# Patient Record
Sex: Male | Born: 1965 | Race: White | Hispanic: No | Marital: Single | Smoking: Current every day smoker
Health system: Southern US, Community
[De-identification: ages and names within clinical notes are randomized; demographics above are authoritative.]

## PROBLEM LIST (undated history)

## (undated) ENCOUNTER — Emergency Department: Payer: Self-pay

## (undated) DIAGNOSIS — Z955 Presence of coronary angioplasty implant and graft: Secondary | ICD-10-CM

## (undated) DIAGNOSIS — N200 Calculus of kidney: Secondary | ICD-10-CM

## (undated) DIAGNOSIS — I499 Cardiac arrhythmia, unspecified: Secondary | ICD-10-CM

## (undated) DIAGNOSIS — M1990 Unspecified osteoarthritis, unspecified site: Secondary | ICD-10-CM

## (undated) DIAGNOSIS — I10 Essential (primary) hypertension: Secondary | ICD-10-CM

## (undated) DIAGNOSIS — I700 Atherosclerosis of aorta: Secondary | ICD-10-CM

## (undated) DIAGNOSIS — E785 Hyperlipidemia, unspecified: Secondary | ICD-10-CM

## (undated) DIAGNOSIS — I77810 Thoracic aortic ectasia: Secondary | ICD-10-CM

## (undated) DIAGNOSIS — R600 Localized edema: Secondary | ICD-10-CM

## (undated) DIAGNOSIS — F419 Anxiety disorder, unspecified: Secondary | ICD-10-CM

## (undated) DIAGNOSIS — K219 Gastro-esophageal reflux disease without esophagitis: Secondary | ICD-10-CM

## (undated) DIAGNOSIS — R0600 Dyspnea, unspecified: Secondary | ICD-10-CM

## (undated) DIAGNOSIS — E669 Obesity, unspecified: Secondary | ICD-10-CM

## (undated) DIAGNOSIS — Z79899 Other long term (current) drug therapy: Secondary | ICD-10-CM

## (undated) DIAGNOSIS — Z7901 Long term (current) use of anticoagulants: Secondary | ICD-10-CM

## (undated) DIAGNOSIS — G4730 Sleep apnea, unspecified: Secondary | ICD-10-CM

## (undated) DIAGNOSIS — Z720 Tobacco use: Secondary | ICD-10-CM

## (undated) DIAGNOSIS — G4700 Insomnia, unspecified: Secondary | ICD-10-CM

## (undated) DIAGNOSIS — I219 Acute myocardial infarction, unspecified: Secondary | ICD-10-CM

## (undated) DIAGNOSIS — Z87442 Personal history of urinary calculi: Secondary | ICD-10-CM

## (undated) DIAGNOSIS — J449 Chronic obstructive pulmonary disease, unspecified: Secondary | ICD-10-CM

## (undated) DIAGNOSIS — I209 Angina pectoris, unspecified: Secondary | ICD-10-CM

## (undated) DIAGNOSIS — K449 Diaphragmatic hernia without obstruction or gangrene: Secondary | ICD-10-CM

## (undated) DIAGNOSIS — K5790 Diverticulosis of intestine, part unspecified, without perforation or abscess without bleeding: Secondary | ICD-10-CM

## (undated) DIAGNOSIS — G4733 Obstructive sleep apnea (adult) (pediatric): Secondary | ICD-10-CM

## (undated) DIAGNOSIS — I2510 Atherosclerotic heart disease of native coronary artery without angina pectoris: Secondary | ICD-10-CM

## (undated) HISTORY — PX: ABDOMINAL SURGERY: SHX537

## (undated) HISTORY — PX: HERNIA REPAIR: SHX51

## (undated) HISTORY — PX: OTHER SURGICAL HISTORY: SHX169

---

## 2006-04-06 ENCOUNTER — Inpatient Hospital Stay: Payer: Self-pay | Admitting: Internal Medicine

## 2006-08-07 ENCOUNTER — Other Ambulatory Visit: Payer: Self-pay

## 2006-08-07 ENCOUNTER — Inpatient Hospital Stay: Payer: Self-pay | Admitting: Internal Medicine

## 2006-08-30 ENCOUNTER — Emergency Department: Payer: Self-pay | Admitting: Emergency Medicine

## 2007-04-10 ENCOUNTER — Observation Stay: Payer: Self-pay | Admitting: Internal Medicine

## 2007-04-10 ENCOUNTER — Other Ambulatory Visit: Payer: Self-pay

## 2008-01-30 ENCOUNTER — Observation Stay: Payer: Self-pay | Admitting: Internal Medicine

## 2009-03-25 IMAGING — CT CT ABD-PELV W/ CM
1 of 2 series · 15 of 32 positions shown, 19 images · non-contrast
Comparison: none

REASON FOR EXAM: (1) RLQ abdominal pain; (2) same
COMMENTS:

[Series 2: appendicitis · axial · 0.91mm/px · z∈[-328,+134]mm · 15 of 168 slices shown, 19 images]
[im 7/168  soft-tissue]
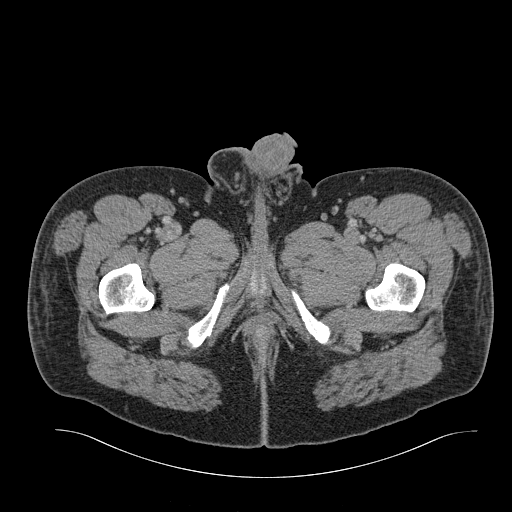
[im 7/168  bone]
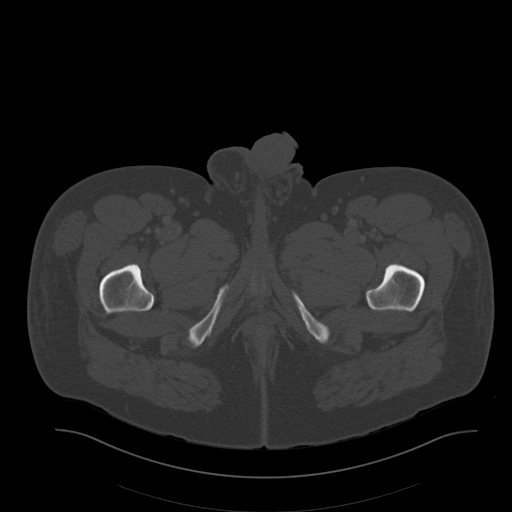
[im 20/168  soft-tissue]
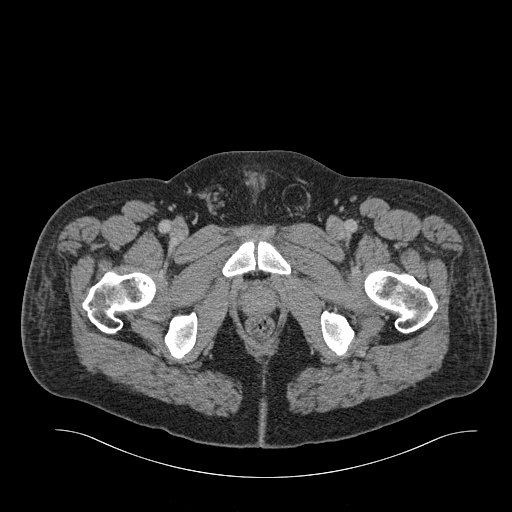
[im 33/168  soft-tissue]
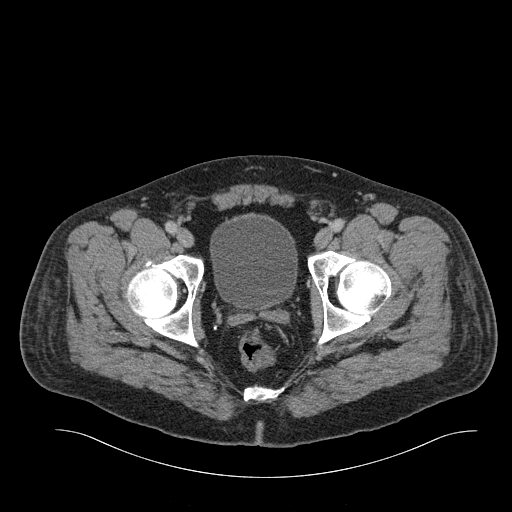
[im 45/168  soft-tissue]
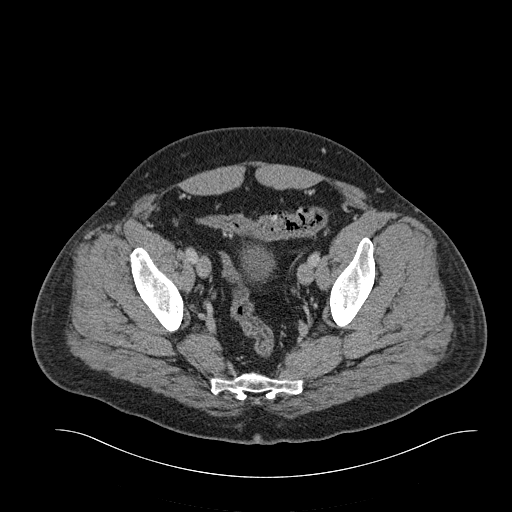
[im 58/168  soft-tissue]
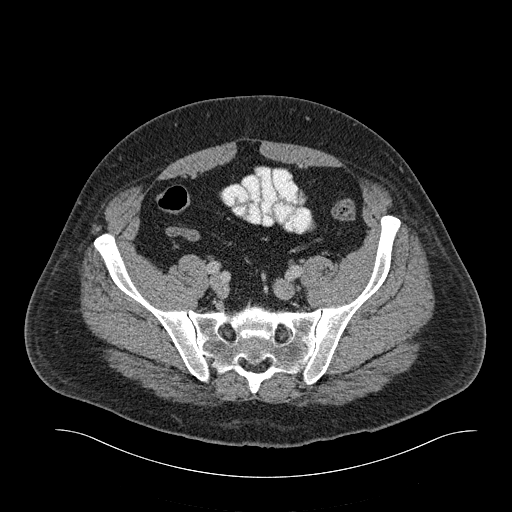
[im 71/168  soft-tissue]
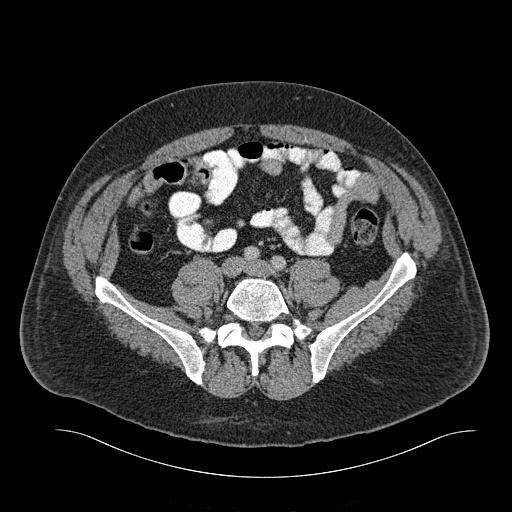
[im 84/168  soft-tissue]
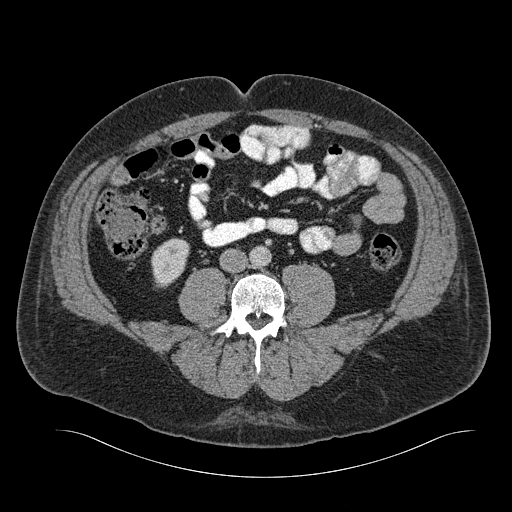
[im 97/168  soft-tissue]
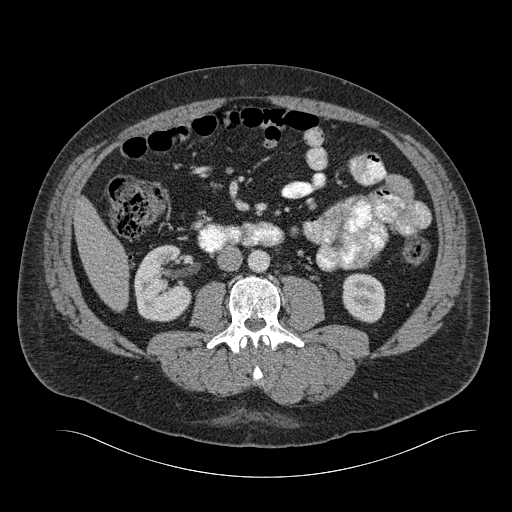
[im 110/168  soft-tissue]
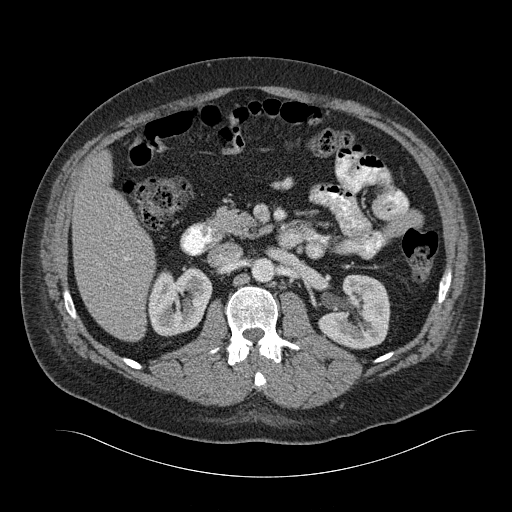
[im 110/168  bone]
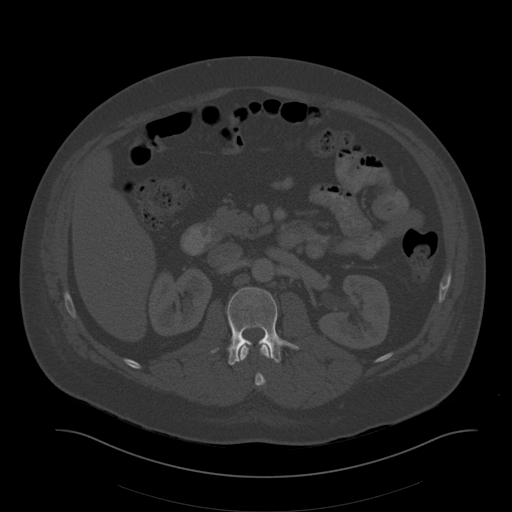
[im 123/168  soft-tissue]
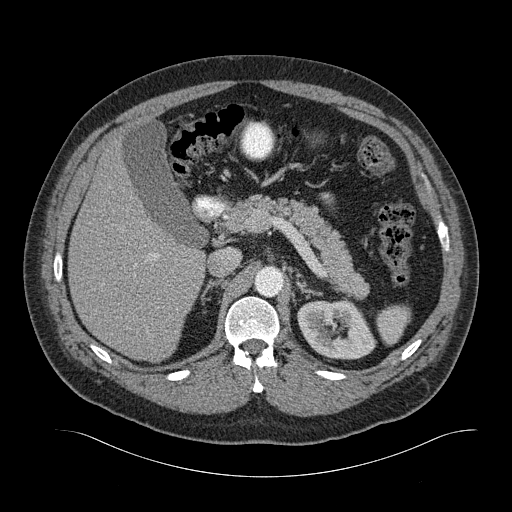
[im 135/168  soft-tissue]
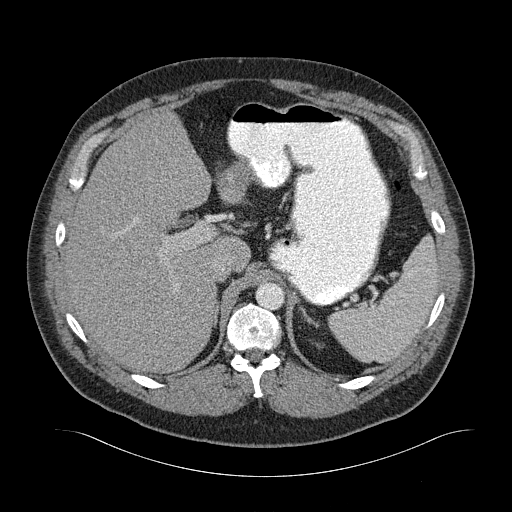
[im 142/168  lung]
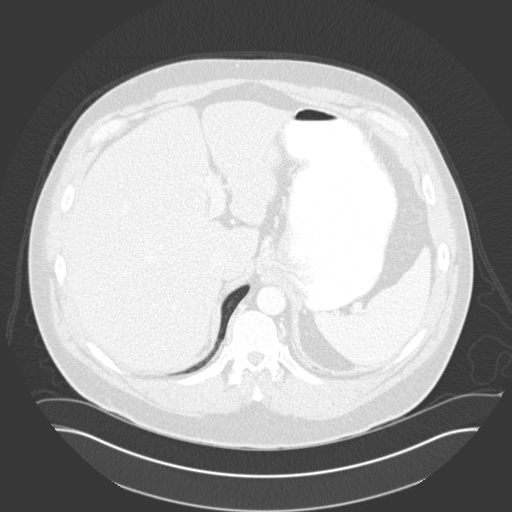
[im 148/168  soft-tissue]
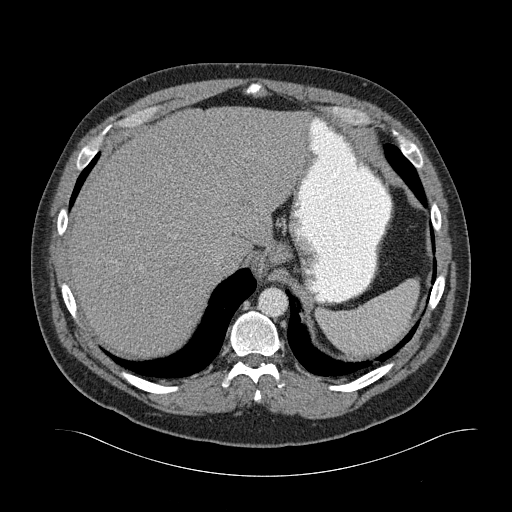
[im 148/168  lung]
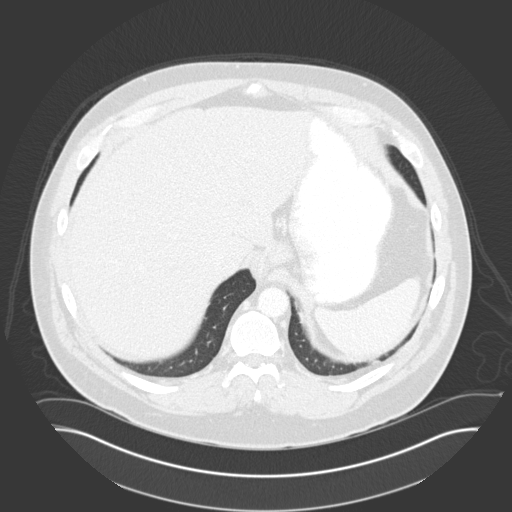
[im 155/168  lung]
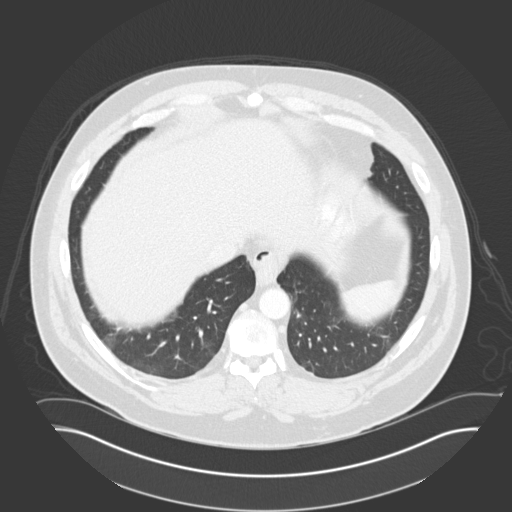
[im 161/168  soft-tissue]
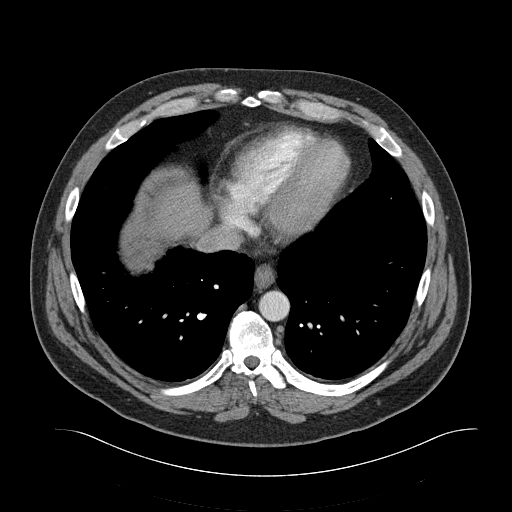
[im 161/168  lung]
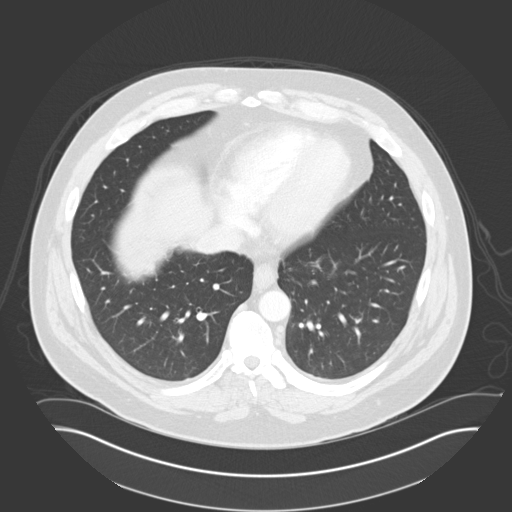

[15 of 32 positions shown; findings below may reference images not displayed]

PROCEDURE:     CT  - CT ABDOMEN / PELVIS  W  - April 07, 2006  [DATE]

RESULT:       Comparison is made to prior study dated 04/06/06.

Helical 3 mm sections were obtained from the lung bases through the pubic
symphysis status post the administration of oral and intravenous
administration of 100 ml of [id number].

Evaluation of the lung bases demonstrates no gross abnormalities.

The liver, spleen, adrenals and pancreas are unremarkable.  There is no
evidence of calcified gallstones within the gallbladder.  A nonobstructing
calcified calculus is demonstrated within the lower pole medullary portion
of the LEFT kidney.  A low attenuating mass is demonstrated within the lower
pole of the RIGHT kidney likely representing a cyst.  The appendix is
identified and demonstrates no radiographic abnormalities.  There is no
evidence of abdominal or pelvic free fluid, drainable loculated fluid
collections, masses or adenopathy.  The celiac, SMA, IMA, portal vein and
SMV are patent. Evaluation of the pelvis demonstrates evidence of
diverticulosis without CT evidence to suggest diverticulitis.
Diverticulosis is demonstrated within the sigmoid colon.  There is no
evidence of pelvic free fluid, drainable loculated fluid collections, masses
or adenopathy.
IMPRESSION: 1.     No CT evidence to suggest the sequela of appendicitis, diverticulitis
or bowel obstruction.
2.     Diverticulosis is demonstrated within the sigmoid colon.

## 2009-03-26 IMAGING — CR DG SHOULDER 3+V*L*
1 series · 3 of 3 positions shown · non-contrast
Comparison: none

REASON FOR EXAM: pain, fall
COMMENTS:

PROCEDURE:     DXR - DXR SHOULDER LEFT COMPLETE  - April 08, 2006  [DATE]
RESULT:     Three views show a mildly comminuted fracture of the humeral
head at the base of the greater tuberosity. No additional fractures about
the shoulder are seen. No dislocation is noted.

[Series 1: view not recorded · 0.17mm/px · 3 of 3 slices shown]
[im 1/3]
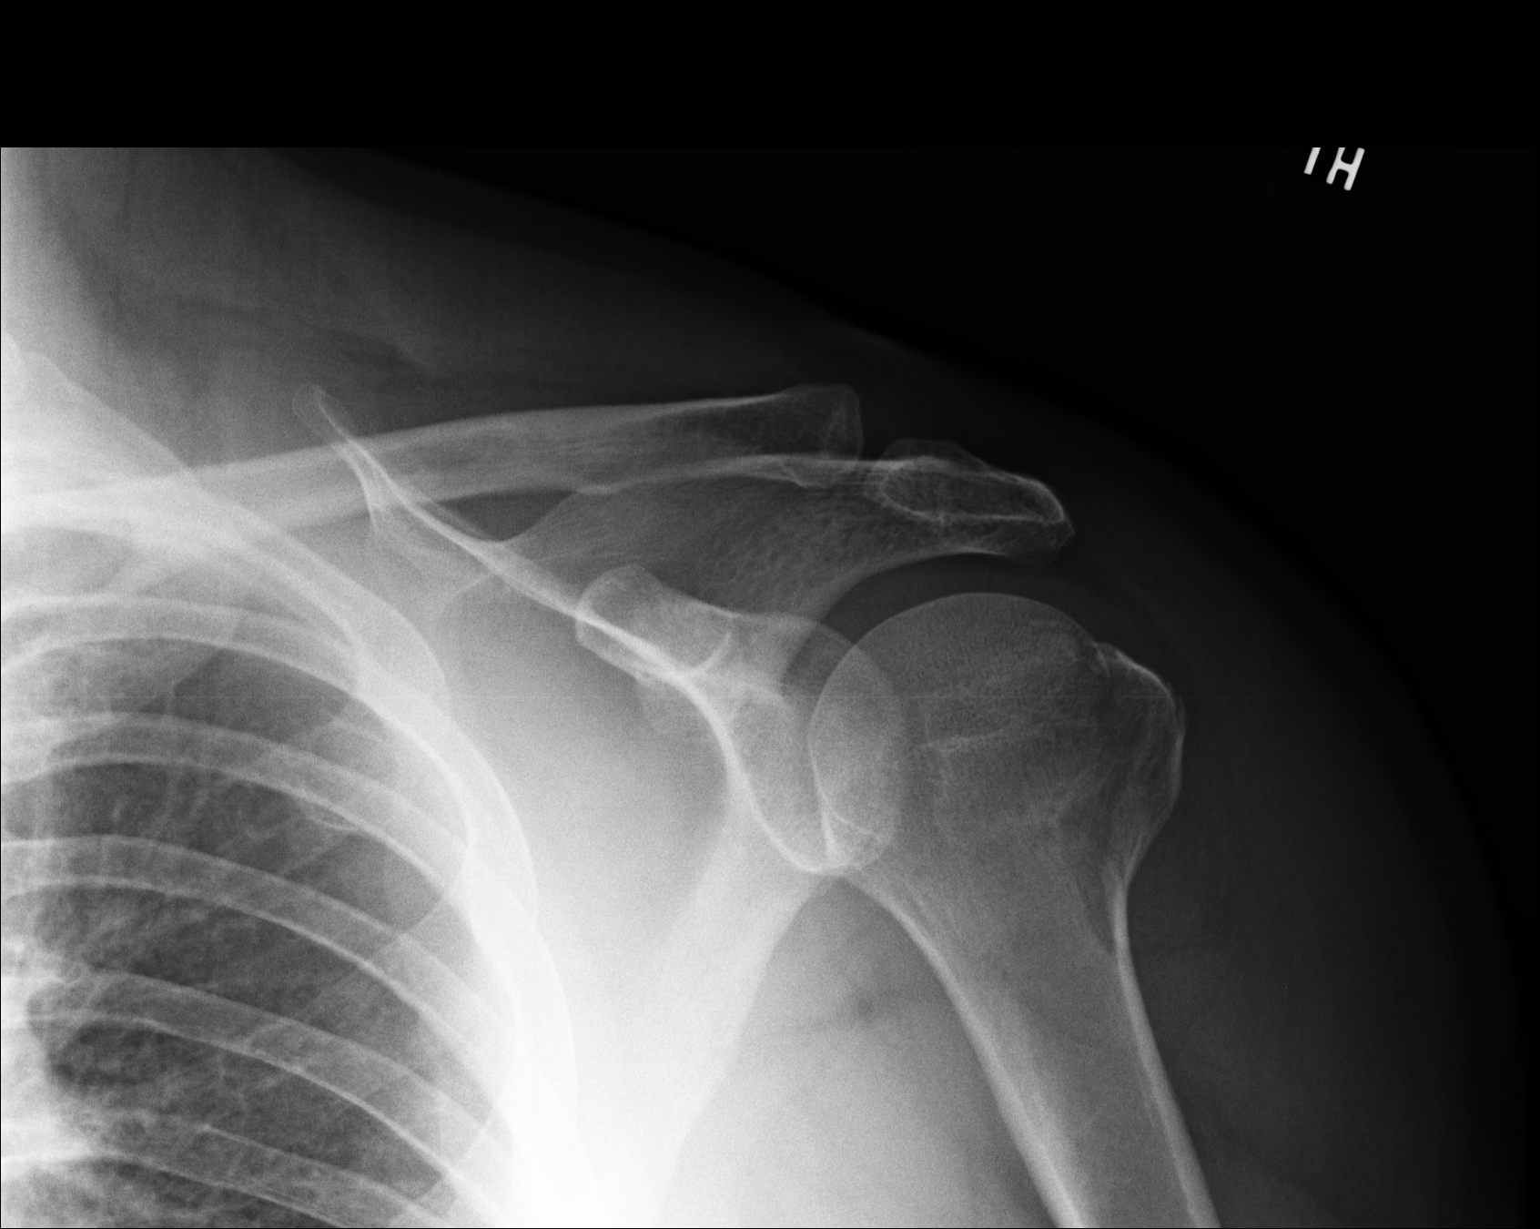
[im 2/3]
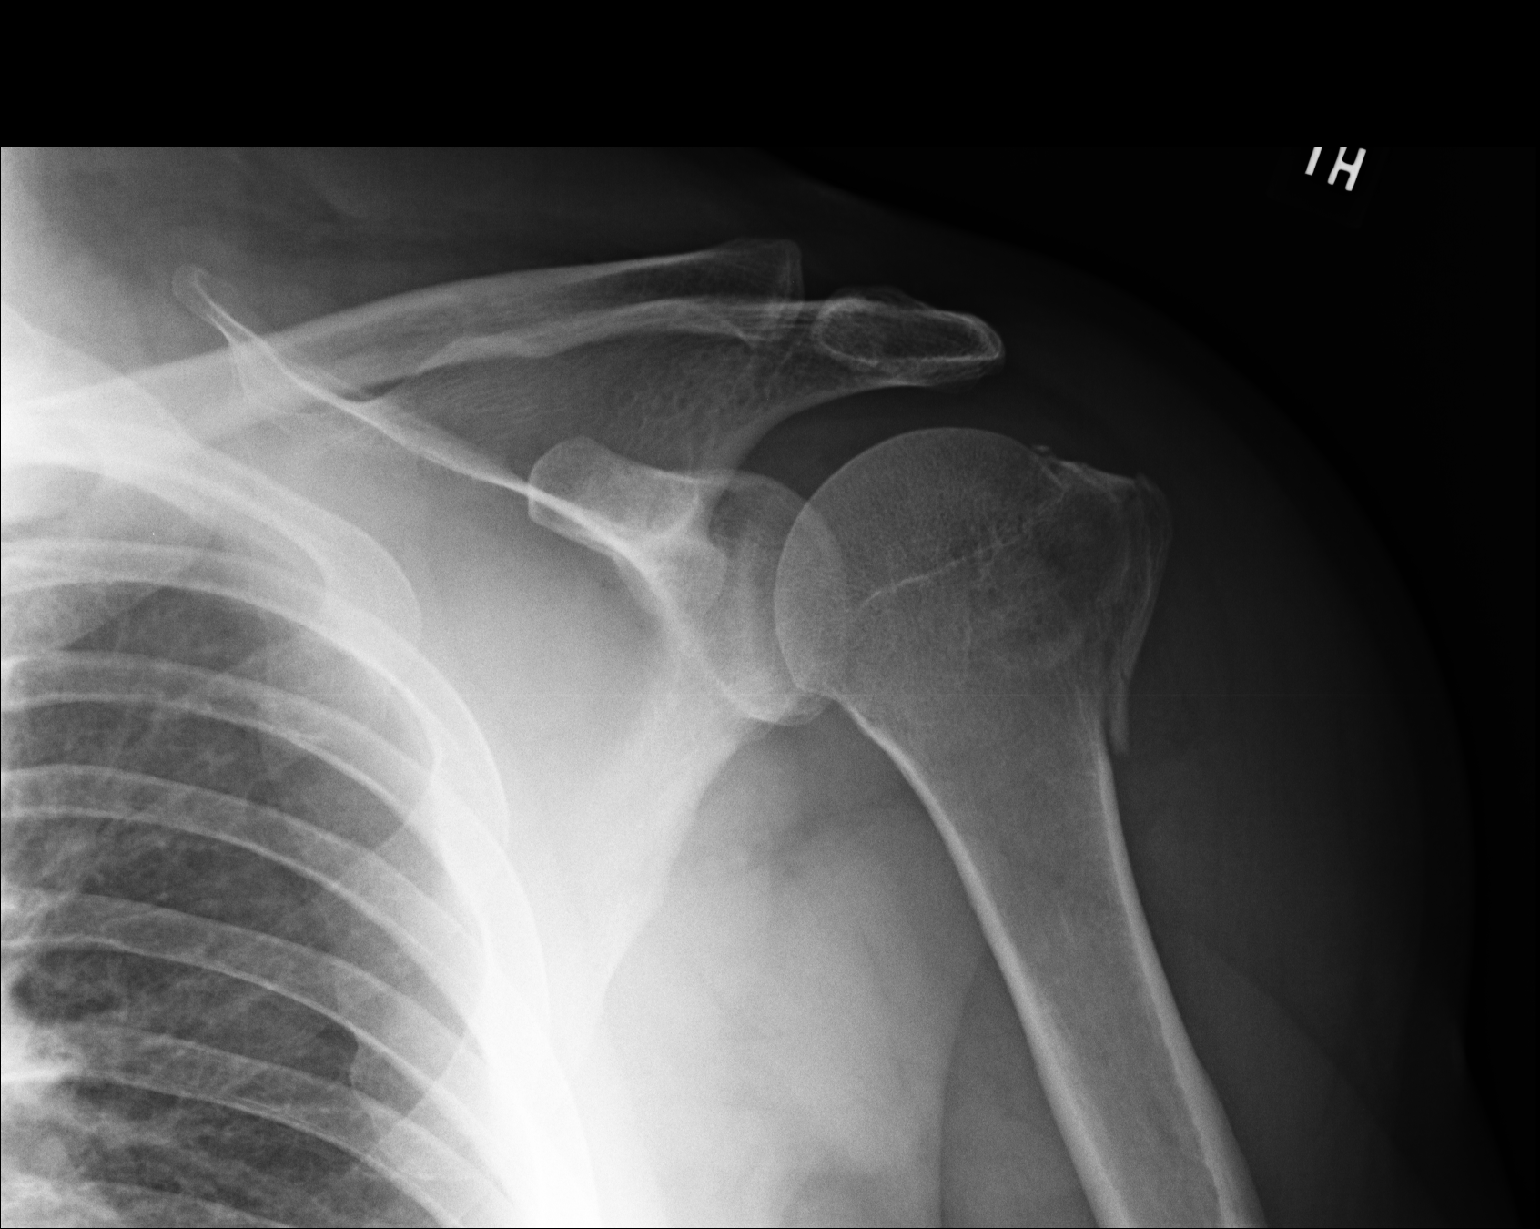
[im 3/3]
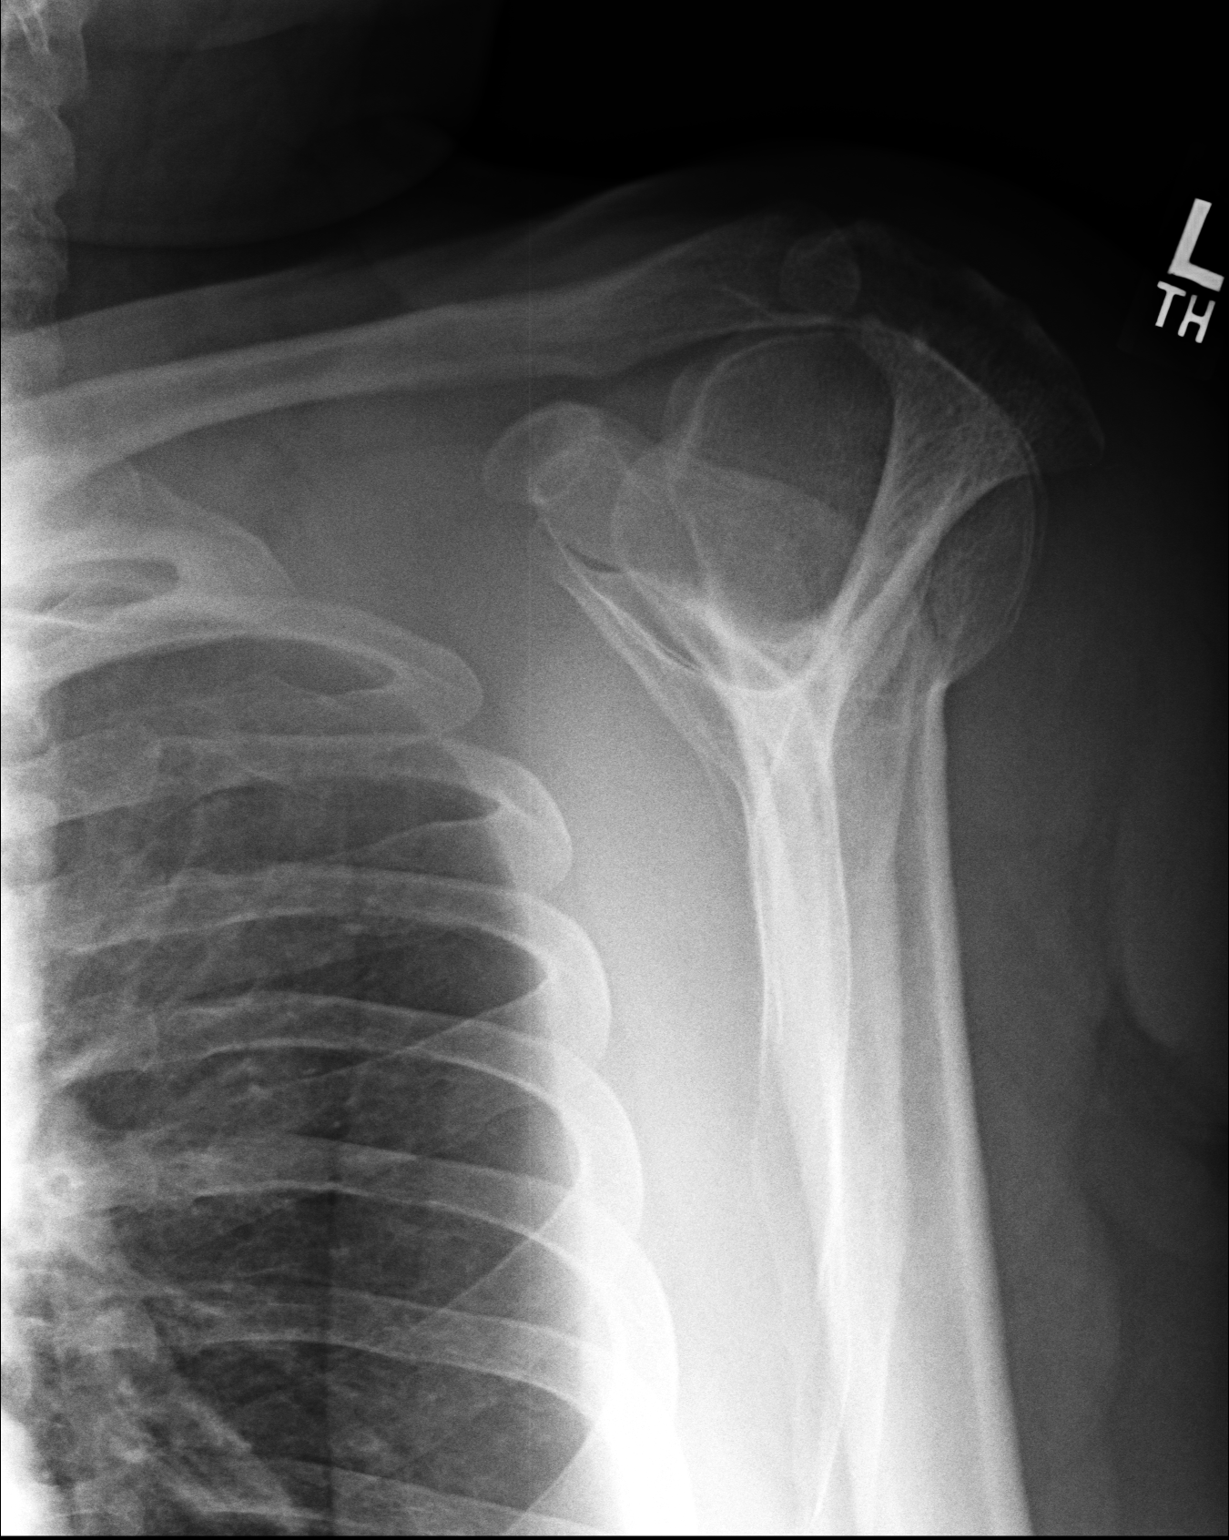

[3 of 3 positions shown; findings below may reference images not displayed]

IMPRESSION: 1.     Fracture of the humeral head as noted above.

## 2011-09-24 ENCOUNTER — Emergency Department: Payer: Self-pay | Admitting: Emergency Medicine

## 2011-09-24 LAB — COMPREHENSIVE METABOLIC PANEL
Albumin: 3.6 g/dL (ref 3.4–5.0)
Alkaline Phosphatase: 69 U/L (ref 50–136)
BUN: 15 mg/dL (ref 7–18)
Bilirubin,Total: 0.3 mg/dL (ref 0.2–1.0)
Creatinine: 0.82 mg/dL (ref 0.60–1.30)
EGFR (African American): >60
Potassium: 3.9 mmol/L (ref 3.5–5.1)
SGPT (ALT): 18 U/L (ref 12–78)
Total Protein: 7 g/dL (ref 6.4–8.2)

## 2011-09-24 LAB — CBC
MCV: 93 fL (ref 80–100)
Platelet: 178 10*3/uL (ref 150–440)
RDW: 13.2 % (ref 11.5–14.5)
WBC: 8.7 10*3/uL (ref 3.8–10.6)

## 2011-09-24 LAB — URINALYSIS, COMPLETE
Bacteria: NONE SEEN
Bilirubin,UR: NEGATIVE
Blood: NEGATIVE
Glucose,UR: NEGATIVE mg/dL (ref 0–75)
Ketone: NEGATIVE
Leukocyte Esterase: NEGATIVE
Nitrite: NEGATIVE
Ph: 6 (ref 4.5–8.0)
Protein: NEGATIVE
Specific Gravity: 1.025 (ref 1.003–1.030)
Squamous Epithelial: <1

## 2012-04-03 ENCOUNTER — Emergency Department: Payer: Self-pay | Admitting: Emergency Medicine

## 2012-04-03 LAB — COMPREHENSIVE METABOLIC PANEL
Alkaline Phosphatase: 85 U/L (ref 50–136)
Calcium, Total: 7.8 mg/dL — ABNORMAL LOW (ref 8.5–10.1)
Chloride: 108 mmol/L — ABNORMAL HIGH (ref 98–107)
Co2: 25 mmol/L (ref 21–32)
Creatinine: 0.93 mg/dL (ref 0.60–1.30)
EGFR (African American): >60
EGFR (Non-African Amer.): >60
Osmolality: 291 (ref 275–301)
Potassium: 3.8 mmol/L (ref 3.5–5.1)
SGOT(AST): 44 U/L — ABNORMAL HIGH (ref 15–37)
Total Protein: 7.2 g/dL (ref 6.4–8.2)

## 2012-04-03 LAB — LIPASE, BLOOD: Lipase: 204 U/L

## 2012-04-03 LAB — CBC
HCT: 47.7 % (ref 40.0–52.0)
HGB: 16.3 g/dL (ref 13.0–18.0)
MCHC: 34.2 g/dL (ref 32.0–36.0)
WBC: 8.1 10*3/uL (ref 3.8–10.6)

## 2012-08-25 ENCOUNTER — Emergency Department: Payer: Self-pay | Admitting: Emergency Medicine

## 2012-08-25 LAB — URINALYSIS, COMPLETE
Bacteria: NONE SEEN
Blood: NEGATIVE
Glucose,UR: NEGATIVE mg/dL (ref 0–75)
Ketone: NEGATIVE
Nitrite: NEGATIVE
Ph: 6 (ref 4.5–8.0)
Protein: NEGATIVE
RBC,UR: 1 /HPF (ref 0–5)
Specific Gravity: 1.005 (ref 1.003–1.030)
WBC UR: <1 /HPF (ref 0–5)

## 2012-08-25 LAB — COMPREHENSIVE METABOLIC PANEL
Albumin: 3.8 g/dL (ref 3.4–5.0)
Chloride: 104 mmol/L (ref 98–107)
EGFR (Non-African Amer.): >60
Glucose: 89 mg/dL (ref 65–99)
Osmolality: 282 (ref 275–301)
SGPT (ALT): 74 U/L (ref 12–78)
Sodium: 142 mmol/L (ref 136–145)
Total Protein: 7.6 g/dL (ref 6.4–8.2)

## 2012-08-25 LAB — CBC
HCT: 49.4 % (ref 40.0–52.0)
HGB: 17.3 g/dL (ref 13.0–18.0)
MCH: 31.1 pg (ref 26.0–34.0)
MCHC: 35 g/dL (ref 32.0–36.0)
MCV: 89 fL (ref 80–100)
Platelet: 206 10*3/uL (ref 150–440)
RBC: 5.57 10*6/uL (ref 4.40–5.90)
WBC: 8.6 10*3/uL (ref 3.8–10.6)

## 2013-08-24 ENCOUNTER — Emergency Department: Payer: Self-pay | Admitting: Emergency Medicine

## 2013-08-25 LAB — TSH: THYROID STIMULATING HORM: 1.18 u[IU]/mL

## 2013-08-25 LAB — ACETAMINOPHEN LEVEL

## 2013-08-25 LAB — CBC
HCT: 46.3 % (ref 40.0–52.0)
HGB: 15.7 g/dL (ref 13.0–18.0)
MCH: 32.1 pg (ref 26.0–34.0)
MCHC: 33.9 g/dL (ref 32.0–36.0)
MCV: 95 fL (ref 80–100)
Platelet: 169 10*3/uL (ref 150–440)
RBC: 4.88 10*6/uL (ref 4.40–5.90)
RDW: 14.6 % — ABNORMAL HIGH (ref 11.5–14.5)
WBC: 9.9 10*3/uL (ref 3.8–10.6)

## 2013-08-25 LAB — URINALYSIS, COMPLETE
BILIRUBIN, UR: NEGATIVE
BLOOD: NEGATIVE
GLUCOSE, UR: NEGATIVE mg/dL (ref 0–75)
Hyaline Cast: 14
Ketone: NEGATIVE
Leukocyte Esterase: NEGATIVE
NITRITE: NEGATIVE
Ph: 5 (ref 4.5–8.0)
Protein: NEGATIVE
RBC,UR: 7 /HPF (ref 0–5)
SPECIFIC GRAVITY: 1.024 (ref 1.003–1.030)
WBC UR: 6 /HPF (ref 0–5)

## 2013-08-25 LAB — DRUG SCREEN, URINE
Amphetamines, Ur Screen: POSITIVE (ref ?–1000)
BENZODIAZEPINE, UR SCRN: NEGATIVE (ref ?–200)
Barbiturates, Ur Screen: NEGATIVE (ref ?–200)
CANNABINOID 50 NG, UR ~~LOC~~: NEGATIVE (ref ?–50)
Cocaine Metabolite,Ur ~~LOC~~: NEGATIVE (ref ?–300)
MDMA (Ecstasy)Ur Screen: NEGATIVE (ref ?–500)
Methadone, Ur Screen: NEGATIVE (ref ?–300)
OPIATE, UR SCREEN: NEGATIVE (ref ?–300)
PHENCYCLIDINE (PCP) UR S: NEGATIVE (ref ?–25)
TRICYCLIC, UR SCREEN: NEGATIVE (ref ?–1000)

## 2013-08-25 LAB — ETHANOL
Ethanol %: 0.034 % (ref 0.000–0.080)
Ethanol: 34 mg/dL

## 2013-08-25 LAB — COMPREHENSIVE METABOLIC PANEL
ALBUMIN: 3.8 g/dL (ref 3.4–5.0)
Alkaline Phosphatase: 61 U/L
Anion Gap: 8 (ref 7–16)
BUN: 21 mg/dL — AB (ref 7–18)
Bilirubin,Total: 0.5 mg/dL (ref 0.2–1.0)
CHLORIDE: 105 mmol/L (ref 98–107)
CREATININE: 1.32 mg/dL — AB (ref 0.60–1.30)
Calcium, Total: 9 mg/dL (ref 8.5–10.1)
Co2: 25 mmol/L (ref 21–32)
EGFR (African American): >60
GLUCOSE: 89 mg/dL (ref 65–99)
Osmolality: 278 (ref 275–301)
POTASSIUM: 3.7 mmol/L (ref 3.5–5.1)
SGOT(AST): 36 U/L (ref 15–37)
SGPT (ALT): 24 U/L
Sodium: 138 mmol/L (ref 136–145)
TOTAL PROTEIN: 7.3 g/dL (ref 6.4–8.2)

## 2013-08-25 LAB — SALICYLATE LEVEL: Salicylates, Serum: 2.6 mg/dL

## 2015-08-13 IMAGING — CR DG CHEST 2V
1 series · 1 of 1 positions shown · non-contrast
Comparison: none

REASON FOR EXAM: cough
COMMENTS:

PROCEDURE:     DXR - DXR CHEST PA (OR AP) AND LATERAL  - August 25, 2012  [DATE]
RESULT:     Comparison: None

[lat]
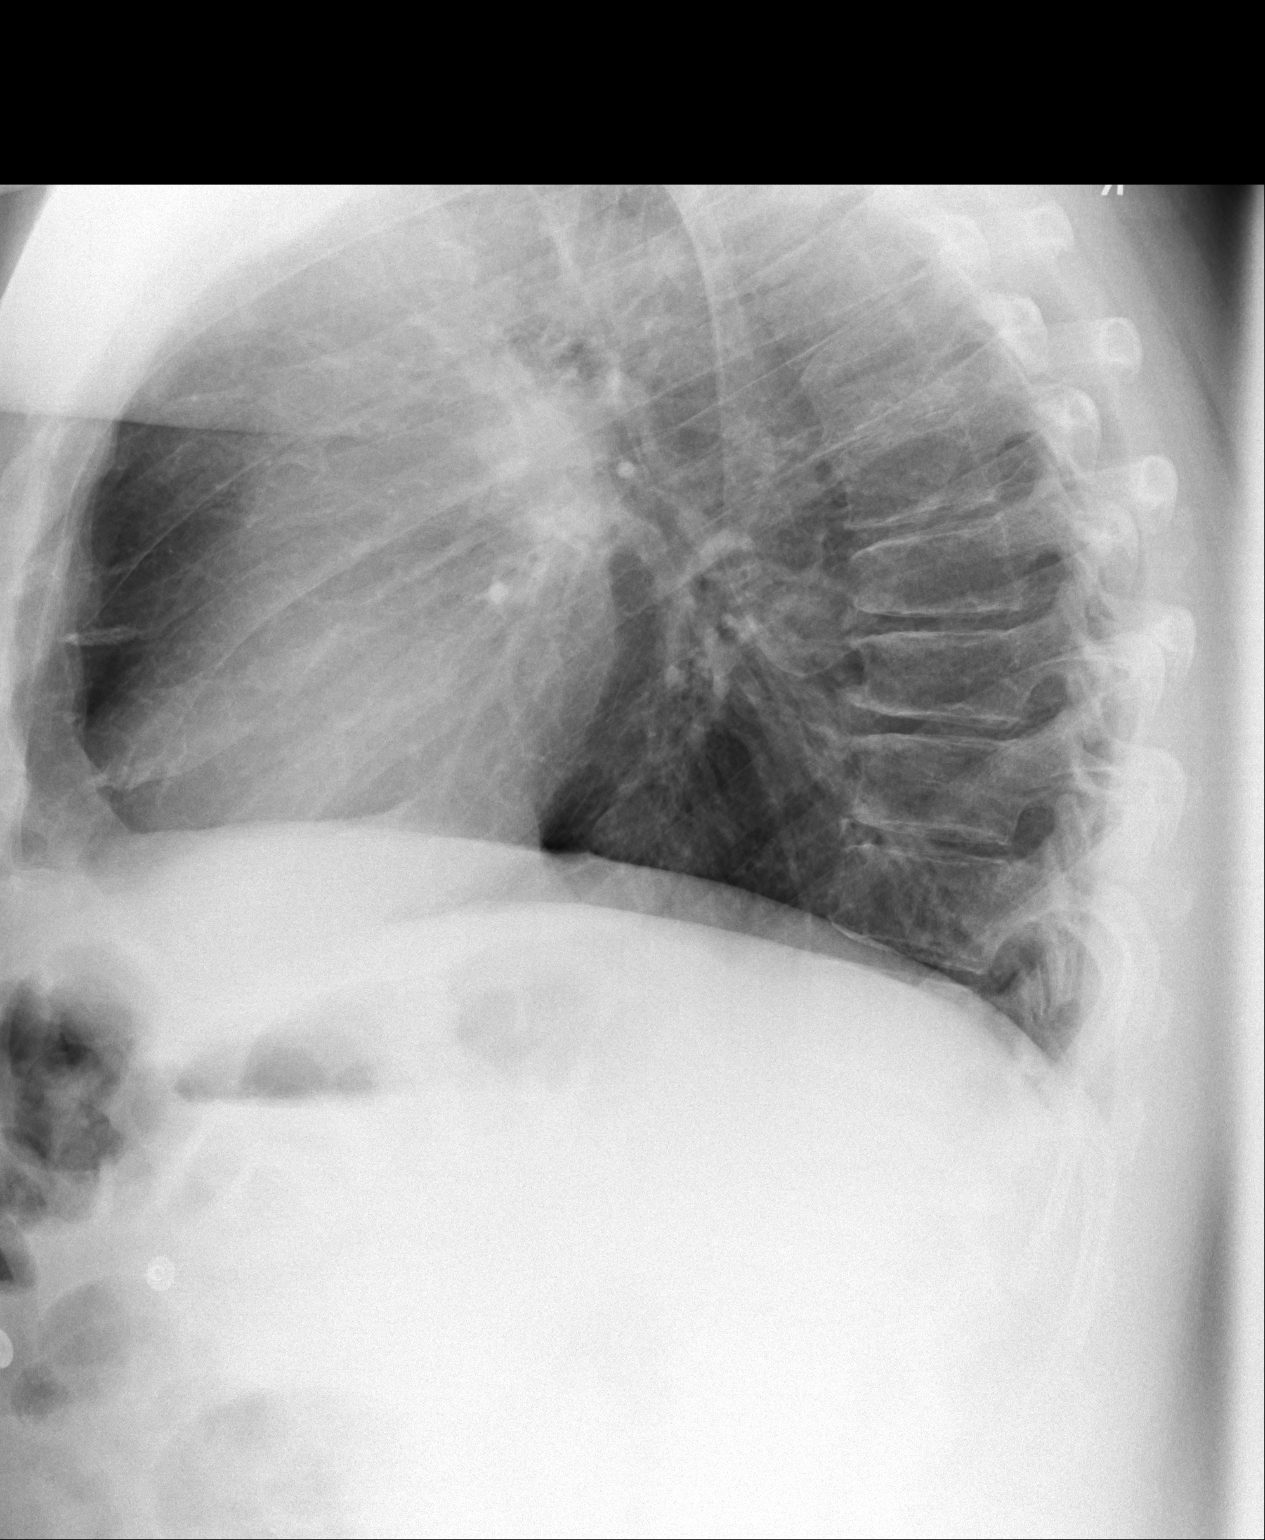

[1 of 1 positions shown; findings below may reference images not displayed]

FINDINGS: PA and lateral chest radiographs are provided.  There is no focal
parenchymal opacity, pleural effusion, or pneumothorax. The heart and
mediastinum are unremarkable.  The osseous structures are unremarkable.
IMPRESSION: No acute disease of the che[REDACTED]

## 2016-09-05 ENCOUNTER — Encounter
Admission: EM | Disposition: A | Discharge status: Home / Self Care | Payer: Self-pay | Source: Home / Self Care | Attending: Internal Medicine

## 2016-09-05 ENCOUNTER — Inpatient Hospital Stay
Admission: EM | Admit: 2016-09-05 | Discharge: 2016-09-07 | DRG: 247 | Disposition: A | Discharge status: Home / Self Care | Payer: Self-pay | Attending: Internal Medicine | Admitting: Internal Medicine

## 2016-09-05 ENCOUNTER — Inpatient Hospital Stay: Payer: Self-pay

## 2016-09-05 ENCOUNTER — Emergency Department: Payer: Self-pay

## 2016-09-05 DIAGNOSIS — I5020 Unspecified systolic (congestive) heart failure: Secondary | ICD-10-CM | POA: Diagnosis present

## 2016-09-05 DIAGNOSIS — E669 Obesity, unspecified: Secondary | ICD-10-CM | POA: Diagnosis present

## 2016-09-05 DIAGNOSIS — T148XXA Other injury of unspecified body region, initial encounter: Secondary | ICD-10-CM

## 2016-09-05 DIAGNOSIS — Y92239 Unspecified place in hospital as the place of occurrence of the external cause: Secondary | ICD-10-CM | POA: Diagnosis not present

## 2016-09-05 DIAGNOSIS — I2119 ST elevation (STEMI) myocardial infarction involving other coronary artery of inferior wall: Secondary | ICD-10-CM

## 2016-09-05 DIAGNOSIS — J209 Acute bronchitis, unspecified: Secondary | ICD-10-CM | POA: Diagnosis present

## 2016-09-05 DIAGNOSIS — Z716 Tobacco abuse counseling: Secondary | ICD-10-CM

## 2016-09-05 DIAGNOSIS — R079 Chest pain, unspecified: Secondary | ICD-10-CM

## 2016-09-05 DIAGNOSIS — I2109 ST elevation (STEMI) myocardial infarction involving other coronary artery of anterior wall: Principal | ICD-10-CM | POA: Diagnosis present

## 2016-09-05 DIAGNOSIS — K219 Gastro-esophageal reflux disease without esophagitis: Secondary | ICD-10-CM | POA: Diagnosis present

## 2016-09-05 DIAGNOSIS — R001 Bradycardia, unspecified: Secondary | ICD-10-CM | POA: Diagnosis present

## 2016-09-05 DIAGNOSIS — T447X5A Adverse effect of beta-adrenoreceptor antagonists, initial encounter: Secondary | ICD-10-CM | POA: Diagnosis not present

## 2016-09-05 DIAGNOSIS — L7632 Postprocedural hematoma of skin and subcutaneous tissue following other procedure: Secondary | ICD-10-CM | POA: Diagnosis not present

## 2016-09-05 DIAGNOSIS — I110 Hypertensive heart disease with heart failure: Secondary | ICD-10-CM | POA: Diagnosis present

## 2016-09-05 DIAGNOSIS — Z87442 Personal history of urinary calculi: Secondary | ICD-10-CM

## 2016-09-05 DIAGNOSIS — Z6837 Body mass index (BMI) 37.0-37.9, adult: Secondary | ICD-10-CM

## 2016-09-05 DIAGNOSIS — F17210 Nicotine dependence, cigarettes, uncomplicated: Secondary | ICD-10-CM | POA: Diagnosis present

## 2016-09-05 DIAGNOSIS — I213 ST elevation (STEMI) myocardial infarction of unspecified site: Secondary | ICD-10-CM

## 2016-09-05 DIAGNOSIS — Y838 Other surgical procedures as the cause of abnormal reaction of the patient, or of later complication, without mention of misadventure at the time of the procedure: Secondary | ICD-10-CM | POA: Diagnosis not present

## 2016-09-05 DIAGNOSIS — R011 Cardiac murmur, unspecified: Secondary | ICD-10-CM | POA: Diagnosis present

## 2016-09-05 HISTORY — PX: LEFT HEART CATH AND CORONARY ANGIOGRAPHY: CATH118249

## 2016-09-05 HISTORY — DX: Gastro-esophageal reflux disease without esophagitis: K21.9

## 2016-09-05 HISTORY — PX: CORONARY/GRAFT ACUTE MI REVASCULARIZATION: CATH118305

## 2016-09-05 HISTORY — DX: Calculus of kidney: N20.0

## 2016-09-05 HISTORY — DX: ST elevation (STEMI) myocardial infarction involving other coronary artery of inferior wall: I21.19

## 2016-09-05 LAB — CBC WITH DIFFERENTIAL/PLATELET
BASOS ABS: 0.1 10*3/uL (ref 0–0.1)
Basophils Relative: 1 %
Eosinophils Absolute: 0.2 10*3/uL (ref 0–0.7)
Eosinophils Relative: 2 %
HEMATOCRIT: 48.6 % (ref 40.0–52.0)
HEMOGLOBIN: 17 g/dL (ref 13.0–18.0)
LYMPHS PCT: 16 %
Lymphs Abs: 1.8 10*3/uL (ref 1.0–3.6)
MCH: 32.7 pg (ref 26.0–34.0)
MCHC: 35 g/dL (ref 32.0–36.0)
MCV: 93.5 fL (ref 80.0–100.0)
Monocytes Absolute: 0.9 10*3/uL (ref 0.2–1.0)
Monocytes Relative: 8 %
NEUTROS ABS: 8.3 10*3/uL — AB (ref 1.4–6.5)
NEUTROS PCT: 73 %
PLATELETS: 188 10*3/uL (ref 150–440)
RBC: 5.2 MIL/uL (ref 4.40–5.90)
RDW: 14.7 % — ABNORMAL HIGH (ref 11.5–14.5)
WBC: 11.2 10*3/uL — AB (ref 3.8–10.6)

## 2016-09-05 LAB — GASTROINTESTINAL PANEL BY PCR, STOOL (REPLACES STOOL CULTURE)
ADENOVIRUS F40/41: NOT DETECTED
Astrovirus: NOT DETECTED
CAMPYLOBACTER SPECIES: NOT DETECTED
CRYPTOSPORIDIUM: NOT DETECTED
CYCLOSPORA CAYETANENSIS: NOT DETECTED
Entamoeba histolytica: NOT DETECTED
Enteroaggregative E coli (EAEC): NOT DETECTED
Enteropathogenic E coli (EPEC): NOT DETECTED
Enterotoxigenic E coli (ETEC): NOT DETECTED
Giardia lamblia: NOT DETECTED
Norovirus GI/GII: NOT DETECTED
PLESIMONAS SHIGELLOIDES: NOT DETECTED
Rotavirus A: NOT DETECTED
SAPOVIRUS (I, II, IV, AND V): NOT DETECTED
SHIGA LIKE TOXIN PRODUCING E COLI (STEC): NOT DETECTED
Salmonella species: NOT DETECTED
Shigella/Enteroinvasive E coli (EIEC): NOT DETECTED
VIBRIO SPECIES: NOT DETECTED
Vibrio cholerae: NOT DETECTED
YERSINIA ENTEROCOLITICA: NOT DETECTED

## 2016-09-05 LAB — COMPREHENSIVE METABOLIC PANEL
ALT: 26 U/L (ref 17–63)
AST: 41 U/L (ref 15–41)
Albumin: 3.9 g/dL (ref 3.5–5.0)
Alkaline Phosphatase: 61 U/L (ref 38–126)
Anion gap: 10 (ref 5–15)
BUN: 15 mg/dL (ref 6–20)
CHLORIDE: 104 mmol/L (ref 101–111)
CO2: 28 mmol/L (ref 22–32)
CREATININE: 1.03 mg/dL (ref 0.61–1.24)
Calcium: 9.3 mg/dL (ref 8.9–10.3)
GFR calc non Af Amer: >60 mL/min (ref >60)
Glucose, Bld: 136 mg/dL — ABNORMAL HIGH (ref 65–99)
Potassium: 4.1 mmol/L (ref 3.5–5.1)
SODIUM: 142 mmol/L (ref 135–145)
Total Bilirubin: 0.6 mg/dL (ref 0.3–1.2)
Total Protein: 6.9 g/dL (ref 6.5–8.1)

## 2016-09-05 LAB — TROPONIN I
Troponin I: 0.05 ng/mL (ref <0.03)
Troponin I: 1.41 ng/mL
Troponin I: 4.31 ng/mL (ref <0.03)
Troponin I: 5.12 ng/mL

## 2016-09-05 LAB — GLUCOSE, CAPILLARY: Glucose-Capillary: 142 mg/dL — ABNORMAL HIGH (ref 65–99)

## 2016-09-05 LAB — LIPID PANEL
CHOL/HDL RATIO: 4.3 ratio
CHOLESTEROL: 197 mg/dL (ref 0–200)
HDL: 46 mg/dL (ref >40)
LDL Cholesterol: UNDETERMINED mg/dL (ref 0–99)
TRIGLYCERIDES: 712 mg/dL — AB (ref <150)
VLDL: UNDETERMINED mg/dL (ref 0–40)

## 2016-09-05 LAB — APTT: APTT: 25 s (ref 24–36)

## 2016-09-05 LAB — MAGNESIUM
MAGNESIUM: 1.8 mg/dL (ref 1.7–2.4)
Magnesium: 2 mg/dL (ref 1.7–2.4)

## 2016-09-05 LAB — POCT ACTIVATED CLOTTING TIME: Activated Clotting Time: 367 seconds

## 2016-09-05 LAB — PROTIME-INR
INR: 0.99
Prothrombin Time: 13.1 seconds (ref 11.4–15.2)

## 2016-09-05 LAB — MRSA PCR SCREENING: MRSA BY PCR: NEGATIVE

## 2016-09-05 LAB — CARDIAC CATHETERIZATION: Cath EF Quantitative: 50 %

## 2016-09-05 SURGERY — LEFT HEART CATH AND CORONARY ANGIOGRAPHY
Anesthesia: Moderate Sedation

## 2016-09-05 MED ORDER — NITROGLYCERIN 5 MG/ML IV SOLN
INTRAVENOUS | Status: AC
  Filled 2016-09-05: qty 10

## 2016-09-05 MED ORDER — FENTANYL CITRATE (PF) 100 MCG/2ML IJ SOLN
INTRAMUSCULAR | Status: DC | PRN
  Administered 2016-09-05: 50 ug via INTRAVENOUS
  Administered 2016-09-05 (×3): 25 ug via INTRAVENOUS

## 2016-09-05 MED ORDER — CLOPIDOGREL BISULFATE 75 MG PO TABS
75.0000 mg | ORAL_TABLET | Freq: Every day | ORAL | Status: DC
  Administered 2016-09-06 – 2016-09-07 (×2): 75 mg via ORAL
  Filled 2016-09-05 (×2): qty 1

## 2016-09-05 MED ORDER — ONDANSETRON HCL 4 MG/2ML IJ SOLN
INTRAMUSCULAR | Status: AC
  Filled 2016-09-05: qty 2

## 2016-09-05 MED ORDER — ENALAPRILAT 1.25 MG/ML IV SOLN
INTRAVENOUS | Status: AC
  Filled 2016-09-05: qty 2

## 2016-09-05 MED ORDER — SODIUM CHLORIDE 0.9 % WEIGHT BASED INFUSION
1.0000 mL/kg/h | INTRAVENOUS | Status: AC
  Administered 2016-09-05: 1 mL/kg/h via INTRAVENOUS

## 2016-09-05 MED ORDER — DIAZEPAM 2 MG PO TABS: 2.0000 mg | ORAL_TABLET | ORAL | Status: DC | PRN

## 2016-09-05 MED ORDER — SODIUM CHLORIDE 0.9 % IV SOLN: 250.0000 mL | INTRAVENOUS | Status: DC | PRN

## 2016-09-05 MED ORDER — LISINOPRIL 5 MG PO TABS
5.0000 mg | ORAL_TABLET | Freq: Two times a day (BID) | ORAL | Status: DC
  Administered 2016-09-05 – 2016-09-06 (×3): 5 mg via ORAL
  Filled 2016-09-05 (×3): qty 1

## 2016-09-05 MED ORDER — HEPARIN SODIUM (PORCINE) 1000 UNIT/ML IJ SOLN
INTRAMUSCULAR | Status: AC
  Administered 2016-09-05: 4000 [IU]
  Filled 2016-09-05: qty 1

## 2016-09-05 MED ORDER — SODIUM CHLORIDE 0.9 % IV SOLN
INTRAVENOUS | Status: DC
  Administered 2016-09-05: 05:00:00 via INTRAVENOUS

## 2016-09-05 MED ORDER — CLOPIDOGREL BISULFATE 75 MG PO TABS
600.0000 mg | ORAL_TABLET | Freq: Once | ORAL | Status: AC
  Administered 2016-09-05: 600 mg via ORAL

## 2016-09-05 MED ORDER — SODIUM CHLORIDE 0.9 % IV SOLN
INTRAVENOUS | Status: AC | PRN
  Administered 2016-09-05: 1.75 mg/kg/h via INTRAVENOUS
  Administered 2016-09-05: 0.25 mg/kg/h via INTRAVENOUS

## 2016-09-05 MED ORDER — ASPIRIN 81 MG PO CHEW: 324.0000 mg | CHEWABLE_TABLET | Freq: Once | ORAL | Status: DC

## 2016-09-05 MED ORDER — IOPAMIDOL (ISOVUE-300) INJECTION 61%
INTRAVENOUS | Status: DC | PRN
  Administered 2016-09-05: 300 mL via INTRA_ARTERIAL

## 2016-09-05 MED ORDER — HYDRALAZINE HCL 20 MG/ML IJ SOLN: 5.0000 mg | INTRAMUSCULAR | Status: AC | PRN

## 2016-09-05 MED ORDER — MIDAZOLAM HCL 2 MG/2ML IJ SOLN
INTRAMUSCULAR | Status: AC
  Filled 2016-09-05: qty 2

## 2016-09-05 MED ORDER — SODIUM CHLORIDE 0.9 % IV SOLN
0.2500 mg/kg/h | INTRAVENOUS | Status: AC
  Administered 2016-09-05: 0.25 mg/kg/h via INTRAVENOUS
  Filled 2016-09-05: qty 250

## 2016-09-05 MED ORDER — KETOROLAC TROMETHAMINE 30 MG/ML IJ SOLN
30.0000 mg | Freq: Once | INTRAMUSCULAR | Status: AC
  Administered 2016-09-05: 30 mg via INTRAVENOUS
  Filled 2016-09-05: qty 1

## 2016-09-05 MED ORDER — ONDANSETRON HCL 4 MG/2ML IJ SOLN
4.0000 mg | Freq: Once | INTRAMUSCULAR | Status: AC
  Administered 2016-09-05: 4 mg via INTRAVENOUS

## 2016-09-05 MED ORDER — METOPROLOL TARTRATE 50 MG PO TABS
50.0000 mg | ORAL_TABLET | Freq: Two times a day (BID) | ORAL | Status: DC
  Administered 2016-09-05 (×2): 50 mg via ORAL
  Filled 2016-09-05 (×2): qty 1

## 2016-09-05 MED ORDER — NITROGLYCERIN 0.4 MG SL SUBL
0.4000 mg | SUBLINGUAL_TABLET | SUBLINGUAL | Status: DC | PRN
  Administered 2016-09-05 (×2): 0.4 mg via SUBLINGUAL

## 2016-09-05 MED ORDER — MIDAZOLAM HCL 2 MG/2ML IJ SOLN
INTRAMUSCULAR | Status: DC | PRN
  Administered 2016-09-05 (×4): 1 mg via INTRAVENOUS

## 2016-09-05 MED ORDER — BIVALIRUDIN TRIFLUOROACETATE 250 MG IV SOLR
INTRAVENOUS | Status: AC
  Filled 2016-09-05: qty 250

## 2016-09-05 MED ORDER — ACETAMINOPHEN 325 MG PO TABS
650.0000 mg | ORAL_TABLET | ORAL | Status: DC | PRN
  Administered 2016-09-06: 650 mg via ORAL
  Filled 2016-09-05: qty 2

## 2016-09-05 MED ORDER — OXYCODONE HCL 5 MG PO TABS
5.0000 mg | ORAL_TABLET | ORAL | Status: DC | PRN
  Administered 2016-09-05: 5 mg via ORAL
  Administered 2016-09-06 – 2016-09-07 (×6): 10 mg via ORAL
  Filled 2016-09-05 (×2): qty 2
  Filled 2016-09-05: qty 1
  Filled 2016-09-05 (×4): qty 2

## 2016-09-05 MED ORDER — HEPARIN SODIUM (PORCINE) 5000 UNIT/ML IJ SOLN: 4000.0000 [IU] | Freq: Once | INTRAMUSCULAR | Status: DC

## 2016-09-05 MED ORDER — FENTANYL CITRATE (PF) 100 MCG/2ML IJ SOLN
INTRAMUSCULAR | Status: AC
  Filled 2016-09-05: qty 2

## 2016-09-05 MED ORDER — BIVALIRUDIN BOLUS VIA INFUSION - CUPID
INTRAVENOUS | Status: DC | PRN
  Administered 2016-09-05: 85.05 mg via INTRAVENOUS

## 2016-09-05 MED ORDER — MAGNESIUM SULFATE 2 GM/50ML IV SOLN
2.0000 g | Freq: Once | INTRAVENOUS | Status: AC
  Administered 2016-09-05: 2 g via INTRAVENOUS
  Filled 2016-09-05: qty 50

## 2016-09-05 MED ORDER — SODIUM CHLORIDE 0.9% FLUSH: 3.0000 mL | INTRAVENOUS | Status: DC | PRN

## 2016-09-05 MED ORDER — ONDANSETRON HCL 4 MG/2ML IJ SOLN
4.0000 mg | Freq: Four times a day (QID) | INTRAMUSCULAR | Status: DC | PRN
  Administered 2016-09-05: 4 mg via INTRAVENOUS

## 2016-09-05 MED ORDER — ATORVASTATIN CALCIUM 20 MG PO TABS
80.0000 mg | ORAL_TABLET | Freq: Every day | ORAL | Status: DC
  Administered 2016-09-05 – 2016-09-06 (×2): 80 mg via ORAL
  Filled 2016-09-05 (×2): qty 4

## 2016-09-05 MED ORDER — HEPARIN (PORCINE) IN NACL 100-0.45 UNIT/ML-% IJ SOLN: 1300.0000 [IU]/h | INTRAMUSCULAR | Status: DC

## 2016-09-05 MED ORDER — LABETALOL HCL 5 MG/ML IV SOLN: 10.0000 mg | INTRAVENOUS | Status: DC | PRN

## 2016-09-05 MED ORDER — HYDROMORPHONE HCL 1 MG/ML IJ SOLN
0.5000 mg | INTRAMUSCULAR | Status: DC | PRN
  Administered 2016-09-05: 0.5 mg via INTRAVENOUS
  Administered 2016-09-06 (×3): 1 mg via INTRAVENOUS
  Filled 2016-09-05 (×4): qty 1

## 2016-09-05 MED ORDER — ENALAPRILAT 1.25 MG/ML IV SOLN
INTRAVENOUS | Status: DC | PRN
  Administered 2016-09-05: 2.5 mg via INTRAVENOUS

## 2016-09-05 MED ORDER — HEPARIN (PORCINE) IN NACL 2-0.9 UNIT/ML-% IJ SOLN
INTRAMUSCULAR | Status: AC | PRN
  Administered 2016-09-05: 1000 mL via INTRA_ARTERIAL

## 2016-09-05 MED ORDER — ASPIRIN 81 MG PO CHEW
81.0000 mg | CHEWABLE_TABLET | Freq: Every day | ORAL | Status: DC
  Administered 2016-09-05 – 2016-09-07 (×3): 81 mg via ORAL
  Filled 2016-09-05 (×3): qty 1

## 2016-09-05 MED ORDER — SODIUM CHLORIDE 0.9% FLUSH
3.0000 mL | Freq: Two times a day (BID) | INTRAVENOUS | Status: DC
  Administered 2016-09-06 – 2016-09-07 (×3): 3 mL via INTRAVENOUS

## 2016-09-05 MED ORDER — TICAGRELOR 90 MG PO TABS
ORAL_TABLET | ORAL | Status: AC
  Filled 2016-09-05: qty 2

## 2016-09-05 SURGICAL SUPPLY — 19 items
BALLN TREK RX 3.0X20 (BALLOONS) ×3
BALLOON TREK RX 3.0X20 (BALLOONS) ×1 IMPLANT
CATH 5FR PIGTAIL DIAGNOSTIC (CATHETERS) ×3 IMPLANT
CATH INFINITI JR4 5F (CATHETERS) ×3 IMPLANT
CATH LAUNCHER 6FR EBU 3.5 SH (CATHETERS) ×3 IMPLANT
CATH LAUNCHER 6FR EBU 4 (CATHETERS) ×3 IMPLANT
CATH LAUNCHER 6FR EBU 4.5 (CATHETERS) ×3 IMPLANT
CATH VISTA GUIDE 6FR XB4.5 (CATHETERS) ×3 IMPLANT
DEVICE CLOSURE MYNXGRIP 6/7F (Vascular Products) ×3 IMPLANT
DEVICE INFLAT 30 PLUS (MISCELLANEOUS) ×3 IMPLANT
FEM STOP ARCH (HEMOSTASIS) ×2
KIT MANI 3VAL PERCEP (MISCELLANEOUS) ×3 IMPLANT
NEEDLE PERC 18GX7CM (NEEDLE) ×3 IMPLANT
PACK CARDIAC CATH (CUSTOM PROCEDURE TRAY) ×3 IMPLANT
SHEATH AVANTI 6FR X 11CM (SHEATH) ×3 IMPLANT
STENT XIENCE ALPINE RX 4.0X28 (Permanent Stent) ×3 IMPLANT
SYSTEM COMPRESSION FEMOSTOP (HEMOSTASIS) ×1 IMPLANT
WIRE EMERALD 3MM-J .035X150CM (WIRE) ×6 IMPLANT
WIRE G HI TQ BMW 190 (WIRE) ×3 IMPLANT

## 2016-09-05 NOTE — H&P (Signed)
[facility] [place] [facility]   PATIENT NAME: Cody Clay    MR#:  [id number]  DATE OF BIRTH:  01/02/1966  DATE OF ADMISSION:  09/05/2016  PRIMARY CARE PHYSICIAN: No primary care provider on file.   REQUESTING/REFERRING PHYSICIAN:   CHIEF COMPLAINT:   Chief Complaint  Patient presents with  . Chest Pain  . Shortness of Breath    HISTORY OF PRESENT ILLNESS:  Cody Clay  is a 51 y.o. male with a known history of Hypertension, tobacco use present to the emergency room with acute onset of chest pain. He was found to have ST elevation MI and was rushed to the cardiac catheterization lab. Mid LAD occlusion was found then has drug eluting stent in place. Complicated by a hematoma and a FemoStop is in place.  PAST MEDICAL HISTORY:   Past Medical History:  Diagnosis Date  . GERD (gastroesophageal reflux disease)   . Kidney stones     PAST SURGICAL HISTORY:   Past Surgical History:  Procedure Laterality Date  . ABDOMINAL SURGERY    . CORONARY/GRAFT ACUTE MI REVASCULARIZATION N/A 09/05/2016   Procedure: Coronary/Graft Acute MI Revascularization;  Surgeon: Alwyn Pea, MD;  Location: [facility] INVASIVE CV LAB;  Service: Cardiovascular;  Laterality: N/A;  prox lad  . HERNIA REPAIR    . LEFT HEART CATH AND CORONARY ANGIOGRAPHY N/A 09/05/2016   Procedure: LEFT HEART CATH AND CORONARY ANGIOGRAPHY;  Surgeon: Alwyn Pea, MD;  Location: [facility] INVASIVE CV LAB;  Service: Cardiovascular;  Laterality: N/A;  . Right knee surgery      SOCIAL HISTORY:   Social History  Substance Use Topics  . Smoking status: Current Every Day Smoker  . Smokeless tobacco: Never Used  . Alcohol use Yes    FAMILY HISTORY:  No family history on file. No premature CAD in the family  DRUG ALLERGIES:  No Known Allergies  REVIEW OF SYSTEMS:   Review of Systems  Constitutional: Positive for malaise/fatigue. Negative for chills, fever and weight loss.  HENT: Negative for  hearing loss and nosebleeds.   Eyes: Negative for blurred vision, double vision and pain.  Respiratory: Negative for cough, hemoptysis, sputum production, shortness of breath and wheezing.   Cardiovascular: Positive for chest pain. Negative for palpitations, orthopnea and leg swelling.  Gastrointestinal: Negative for abdominal pain, constipation, diarrhea, nausea and vomiting.  Genitourinary: Negative for dysuria and hematuria.  Musculoskeletal: Negative for back pain, falls and myalgias.  Skin: Negative for rash.  Neurological: Positive for weakness. Negative for dizziness, tremors, sensory change, speech change, focal weakness, seizures and headaches.  Endo/Heme/Allergies: Does not bruise/bleed easily.  Psychiatric/Behavioral: Negative for depression and memory loss. The patient is not nervous/anxious.     MEDICATIONS AT HOME:   Prior to Admission medications   Not on File     VITAL SIGNS:  Blood pressure (!) 133/117, pulse 72, temperature 97.9 F (36.6 C), temperature source Oral, resp. rate 14, height 6' (1.829 m), weight 125.9 kg (277 lb 9 oz), SpO2 98 %.  PHYSICAL EXAMINATION:  Physical Exam  GENERAL:  51 y.o.-year-old patient lying in the bed with no acute distress.  EYES: Pupils equal, round, reactive to light and accommodation. No scleral icterus. Extraocular muscles intact.  HEENT: Head atraumatic, normocephalic. Oropharynx and nasopharynx clear. No oropharyngeal erythema, moist oral mucosa  NECK:  Supple, no jugular venous distention. No thyroid enlargement, no tenderness.  LUNGS: Normal breath sounds bilaterally, no wheezing, rales, rhonchi. No use of accessory muscles of  respiration.  CARDIOVASCULAR: S1, S2 normal. No murmurs, rubs, or gallops.  ABDOMEN: Soft, nontender, nondistended. Bowel sounds present. No organomegaly or mass.  EXTREMITIES: No pedal edema, cyanosis, or clubbing. + 2 pedal & radial pulses b/l.   NEUROLOGIC: Cranial nerves II through XII are intact.  No focal Motor or sensory deficits appreciated b/l PSYCHIATRIC: The patient is alert and oriented x 3. Good affect.  SKIN: No obvious rash, lesion, or ulcer.  FemoStop in place.  LABORATORY PANEL:   CBC  Recent Labs Lab 09/05/16 0502  WBC 11.2*  HGB 17.0  HCT 48.6  PLT 188   ------------------------------------------------------------------------------------------------------------------  Chemistries   Recent Labs Lab 09/05/16 0502  NA 142  K 4.1  CL 104  CO2 28  GLUCOSE 136*  BUN 15  CREATININE 1.03  CALCIUM 9.3  AST 41  ALT 26  ALKPHOS 61  BILITOT 0.6   ------------------------------------------------------------------------------------------------------------------  Cardiac Enzymes  Recent Labs Lab 09/05/16 0502  TROPONINI 0.05*   ------------------------------------------------------------------------------------------------------------------  RADIOLOGY:  Dg Chest Port 1 View  Result Date: 09/05/2016 CLINICAL DATA:  Shortness of breath and chest pain. EXAM: PORTABLE CHEST 1 VIEW COMPARISON:  None. FINDINGS: Heart is at the upper limits normal in size. Mediastinal contours are normal. The lungs are clear. Pulmonary vasculature is normal. No consolidation, pleural effusion, or pneumothorax. No acute osseous abnormalities are seen. IMPRESSION: Upper normal heart size.  Lungs are clear. Electronically Signed   By: Rubye Oaks M.D.   On: 09/05/2016 05:36     IMPRESSION AND PLAN:   * ST elevation MI with occlusion of mid LAD Status post-drug eluting stent. On aspirin, Plavix, statin. Metoprolol and ACE inhibitor. Dr. Juliann Pares with cardiology following the patient  * Right groin hematoma. FemoStop in place Ultrasound ordered  * Hypertension. On metoprolol and lisinopril  * Tobacco abuse. Counseled patient to quit smoking for greater than 3 minutes. He is very determined to quit at this time after MI.  All the records are reviewed and case  discussed with ED provider. Management plans discussed with the patient, family and they are in agreement.  CODE STATUS: FULL CODE  TOTAL TIME TAKING CARE OF THIS PATIENT: 40 minutes.   Milagros Loll R M.D on 09/05/2016 at 10:03 AM  Between 7am to 6pm - Pager - [phone]  After 6pm go to [email or web address] - password [email or web address] [facility]  [facility] [place] [facility]  Office  [phone]  CC: Primary care physician; No primary care provider on file.  Note: This dictation was prepared with Dragon dictation along with smaller phrase technology. Any transcriptional errors that result from this process are unintentional.

## 2016-09-05 NOTE — Care Management (Signed)
Currently there are no demographics, social security number, or insurance information in chart. RNCM team to continue to follow.

## 2016-09-05 NOTE — Progress Notes (Addendum)
FemoStop removed at this time. Pt remains laying flat at this time. Site level 0. Ultrasound notified that pt is able to have scan completed. Will continue to assess.

## 2016-09-05 NOTE — ED Notes (Signed)
Pt's belongings given to family. 

## 2016-09-05 NOTE — Plan of Care (Signed)
Problem: Food- and Nutrition-Related Knowledge Deficit (NB-1.1) Goal: Nutrition education Formal process to instruct or train a patient/client in a skill or to impart knowledge to help patients/clients voluntarily manage or modify food choices and eating behavior to maintain or improve health. Outcome: Adequate for Discharge Nutrition Education Note  RD consulted for nutrition education regarding a Heart Healthy diet.   Lipid Panel     Component Value Date/Time   CHOL 197 09/05/2016 0502   TRIG 712 (H) 09/05/2016 0502   HDL 46 09/05/2016 0502   CHOLHDL 4.3 09/05/2016 0502   VLDL UNABLE TO CALCULATE IF TRIGLYCERIDE OVER 400 mg/dL 00/37/0488 8916   LDLCALC UNABLE TO CALCULATE IF TRIGLYCERIDE OVER 400 mg/dL 94/50/3888 2800    RD provided "Heart Healthy Nutrition Therapy" handout from the Academy of [employer]. Reviewed patient's dietary recall. Provided examples on ways to decrease sodium and fat intake in diet. Discouraged intake of processed foods and use of salt shaker. Encouraged fresh fruits and vegetables as well as whole grain sources of carbohydrates to maximize fiber intake. Teach back method used.  Expect fair compliance.  Body mass index is 37.64 kg/m. Pt meets criteria for obese class II based on current BMI.  Current diet order is heart healthy, Labs and medications reviewed. No further nutrition interventions warranted at this time. RD contact information provided. If additional nutrition issues arise, please re-consult RD.  Dionne Ano. Lamond Glantz, MS, RD LDN Inpatient Clinical Dietitian Pager [phone]

## 2016-09-05 NOTE — Progress Notes (Signed)
eLink Physician-Brief Progress Note Patient Name: Cody Clay DOB: July 22, 1965 MRN: [id number]   Date of Service  09/05/2016  HPI/Events of Note  Notified by nurse of patient complaining of pain. Camera shows patient with partially eaten dinner at bedside. Watching TV. Patient rates pain as 6/10. Describes it as a cramping abdominal pain. Spoke with bedside nurse briefly who also reports patient takes Suboxone at home and is having frequent bowel movements now as well. Question possible opiate withdrawal. Patient received IV dye this morning and was started on ACE inhibitor.   eICU Interventions  1. Recommended continuing with oxycodone and Dilaudid as needed for symptom control 2. Avoiding NSAIDs given potential for renal injury      Intervention Category Intermediate Interventions: Pain - evaluation and management  Lawanda Cousins 09/05/2016, 5:16 PM

## 2016-09-05 NOTE — Progress Notes (Signed)
Pt metoprolol delayed, due to BP concerns. HR 69. Per patient, the patient has never taken antihypertensives, BP systolic 100 after 5 mg of Lisinopril.  Dr.Callwood notified and agreed to hold for now and reassess later. Will continue to assess.

## 2016-09-05 NOTE — Progress Notes (Signed)
CH made a follow up visit. Pt is resting well in IC-02. Family is appreciative of the care giving by the Medical staff and spiritual support.

## 2016-09-05 NOTE — ED Provider Notes (Signed)
[facility] Emergency Department Provider Note   ____________________________________________   First MD Initiated Contact with Patient 09/05/16 [phone]     (approximate)  I have reviewed the triage vital signs and the nursing notes.   HISTORY  Chief Complaint Chest Pain and Shortness of Breath    HPI Cody Clay is a 51 y.o. male brought to the ED from home via EMS with a chief complaint of chest pain. Patient awoke with "crushing central chest pain" radiating to his left arm associated with numbness and tingling. States he has been having nonproductive cough and nausea/vomiting/diarrhea for the past week. EMS reports patient was clammy, pale and diaphoretic upon their arrival and short of breath. Patient had taken 3 baby aspirin's prior to EMS arrival. EMS gave additional 324 aspirin and one dose of nitroglycerin spray. They report initial monitor within normal limits but changed after patient ambulated to the ambulance.EMS faxed EKG which demonstrates hyperacute T waves and inferior lateral leads concerning for ST elevation MI. Code STEMI was activated in the field.   Past medical history None  There are no active problems to display for this patient.   Past Surgical History:  Procedure Laterality Date  . ABDOMINAL SURGERY    . HERNIA REPAIR    . Right knee surgery      Prior to Admission medications   Not on File    Allergies Patient has no known allergies.  Family history None for CAD  Social History Social History  Substance Use Topics  . Smoking status: Current Every Day Smoker  . Smokeless tobacco: Never Used  . Alcohol use Yes    Review of Systems  Constitutional: No fever/chills. Eyes: No visual changes. ENT: No sore throat. Cardiovascular: positive for chest pain. Respiratory: positive for shortness of breath. Gastrointestinal: No abdominal pain.  Positive for nausea, vomiting and diarrhea.  No  constipation. Genitourinary: Negative for dysuria. Musculoskeletal: Negative for back pain. Skin: Negative for rash. Neurological: Negative for headaches, focal weakness or numbness.   ____________________________________________   PHYSICAL EXAM:  VITAL SIGNS: ED Triage Vitals  Enc Vitals Group     BP 09/05/16 0504 (!) 159/99     Pulse Rate 09/05/16 0504 83     Resp 09/05/16 0504 20     Temp 09/05/16 0504 98.1 F (36.7 C)     Temp Source 09/05/16 0504 Oral     SpO2 09/05/16 0504 96 %     Weight 09/05/16 0505 250 lb (113.4 kg)     Height 09/05/16 0505 6' (1.829 m)     Head Circumference --      Peak Flow --      Pain Score 09/05/16 0503 8     Pain Loc --      Pain Edu? --      Excl. in GC? --     Constitutional: Alert and oriented. Uncomfortable appearing and in moderate acute distress. Eyes: Conjunctivae are normal. PERRL. EOMI. Head: Atraumatic. Nose: No congestion/rhinnorhea. Mouth/Throat: Mucous membranes are moist.  Oropharynx non-erythematous. Neck: No stridor.   Cardiovascular: Normal rate, regular rhythm. Grossly normal heart sounds.  Good peripheral circulation. Respiratory: Normal respiratory effort.  No retractions. Lungs CTAB. Gastrointestinal: Soft and nontender. No distention. No abdominal bruits. No CVA tenderness. Musculoskeletal: No lower extremity tenderness nor edema.  No joint effusions. Neurologic:  Normal speech and language. No gross focal neurologic deficits are appreciated.  Skin:  Skin is diaphoretic, clammy, cool and intact. No rash noted. Psychiatric: Mood  and affect are normal. Speech and behavior are normal.  ____________________________________________   LABS (all labs ordered are listed, but only abnormal results are displayed)  Labs Reviewed  CBC WITH DIFFERENTIAL/PLATELET - Abnormal; Notable for the following:       Result Value   WBC 11.2 (*)    RDW 14.7 (*)    Neutro Abs 8.3 (*)    All other components within normal limits   PROTIME-INR  APTT  COMPREHENSIVE METABOLIC PANEL  TROPONIN I  LIPID PANEL   ____________________________________________  EKG  ED ECG REPORT I, SUNG,JADE J, the attending physician, personally viewed and interpreted this ECG.   Date: 09/05/2016  EKG Time: 0502  Rate: 74  Rhythm: normal EKG, normal sinus rhythm, hyperacute T waves in lateral leads, ST elevation in inferior leads concerning for ST elevation MI  Axis: normal  Intervals:none  ST&T Change: concerning for STEMI  ____________________________________________  RADIOLOGY  Dg Chest Port 1 View  Result Date: 09/05/2016 CLINICAL DATA:  Shortness of breath and chest pain. EXAM: PORTABLE CHEST 1 VIEW COMPARISON:  None. FINDINGS: Heart is at the upper limits normal in size. Mediastinal contours are normal. The lungs are clear. Pulmonary vasculature is normal. No consolidation, pleural effusion, or pneumothorax. No acute osseous abnormalities are seen. IMPRESSION: Upper normal heart size.  Lungs are clear. Electronically Signed   By: Rubye Oaks M.D.   On: 09/05/2016 05:36    ____________________________________________   PROCEDURES  Procedure(s) performed: None  Procedures  Critical Care performed: Yes, see critical care note(s)   CRITICAL CARE Performed by: Irean Hong   Total critical care time: 30 minutes  Critical care time was exclusive of separately billable procedures and treating other patients.  Critical care was necessary to treat or prevent imminent or life-threatening deterioration.  Critical care was time spent personally by me on the following activities: development of treatment plan with patient and/or surrogate as well as nursing, discussions with consultants, evaluation of patient's response to treatment, examination of patient, obtaining history from patient or surrogate, ordering and performing treatments and interventions, ordering and review of laboratory studies, ordering and review of  radiographic studies, pulse oximetry and re-evaluation of patient's condition.  ____________________________________________   INITIAL IMPRESSION / ASSESSMENT AND PLAN / ED COURSE  Pertinent labs & imaging results that were available during my care of the patient were reviewed by me and considered in my medical decision making (see chart for details).  51 year old male brought by EMS as code STEMI. Patient is pale, diaphoretic, clammy in moderate distress. Has evolving EKGs. Nitroglycerin initiated. Will speak with cardiologist.  Clinical Course as of Sep 06 539  Fri Sep 05, 2016  2585 Code STEMI was initiated per EMS. I spoke with cardiology on call Dr. Juliann Pares who is en route. Recommends heparin bolus and Plavix.  [JS]    Clinical Course User Index [JS] Irean Hong, MD     ____________________________________________   FINAL CLINICAL IMPRESSION(S) / ED DIAGNOSES  Final diagnoses:  Chest pain, unspecified type  ST elevation myocardial infarction (STEMI), unspecified artery (HCC)      NEW MEDICATIONS STARTED DURING THIS VISIT:  There are no discharge medications for this patient.    Note:  This document was prepared using Dragon voice recognition software and may include unintentional dictation errors.    Irean Hong, MD 09/05/16 [phone]

## 2016-09-05 NOTE — Progress Notes (Signed)
Dr.Kowalski returned page. Orders to continue to observe patient. Orders acknowledged.

## 2016-09-05 NOTE — Progress Notes (Signed)
Spoke with Dr. Juliann Pares regarding Femostop device. Verbal orders to begin removal at 12 noon per protocol. Orders read back and acknowledged. Will continue to assess.

## 2016-09-05 NOTE — Consult Note (Signed)
Name: Cody Clay MRN: [id number] DOB: [birth year]    ADMISSION DATE:  09-18-16 CONSULTATION DATE: 09/18/2016  REFERRING MD : Dr. Elpidio Anis  CHIEF COMPLAINT: Chest Pain   BRIEF PATIENT DESCRIPTION:  51 yo male admitted 19-Sep-2022 with anterior lateral STEMI s/p cardiac catheterization requiring Prox LAD drug eluting stent due to 99% stenosis  STUDIES: Cardiac Catheterization Sep 19, 2022>There is mild left ventricular systolic dysfunction LV end diastolic pressure is normal. The left ventricular ejection fraction is 50-55% by visual estimate. A STENT XIENCE ALPINE RX [id number] drug eluting stent was successfully placed, and does not overlap previously placed stent. Prox LAD to Mid LAD lesion, 99 %stenosed. Post intervention, there is a 0% residual stenosis. Ost 1st Diag lesion, 90 %stenosed.   HISTORY OF PRESENT ILLNESS:   This is a 51 yo male with a PMH of current smoker, obesity, and HTN.  He presented to [facility] ER via EMS 2022/09/19 with c/o chest pain, diaphoresis, and shortness of breath onset of symptoms one week ago.  Upon EMS arrival   EKG concerning for STEMI.  In the ER EKG revealed inferolateral ST elevation, therefore he was emergently transported to the cath lab for cardiac catheterization with findings of 99% stenosis of the Prox LAD to Mid LAD requiring drug eluting stent, post intervention there was 0% residual stenosis.  Post cardiac cath the pt developed a right groin hematoma requiring femstop placement.  He was subsequently admitted to the ICU for further workup and treatment.  PAST MEDICAL HISTORY :   has a past medical history of GERD (gastroesophageal reflux disease) and Kidney stones.  has a past surgical history that includes Abdominal surgery; Hernia repair; Right knee surgery; LEFT HEART CATH AND CORONARY ANGIOGRAPHY (N/A, 2016/09/18); and Coronary/Graft Acute MI Revascularization (N/A, Sep 18, 2016). Prior to Admission medications   Not on File   No Known Allergies  FAMILY HISTORY:    family history is not on file. SOCIAL HISTORY:  reports that he has been smoking.  He has never used smokeless tobacco. He reports that he drinks alcohol. He reports that he does not use drugs.  REVIEW OF SYSTEMS: Positives in BOLD Constitutional: Negative for fever, chills, weight loss, malaise/fatigue and diaphoresis.  HENT: Negative for hearing loss, ear pain, nosebleeds, congestion, sore throat, neck pain, tinnitus and ear discharge.   Eyes: Negative for blurred vision, double vision, photophobia, pain, discharge and redness.  Respiratory: Negative for cough, hemoptysis, sputum production, shortness of breath, wheezing and stridor.   Cardiovascular: chest pain, palpitations, orthopnea, claudication, leg swelling and PND.  Gastrointestinal: Negative for heartburn, nausea, vomiting, abdominal pain, diarrhea, constipation, blood in stool and melena.  Genitourinary: Negative for dysuria, urgency, frequency, hematuria and flank pain.  Musculoskeletal: Negative for myalgias, back pain, joint pain and falls.  Skin: Negative for itching and rash.  Neurological: Negative for dizziness, tingling, tremors, sensory change, speech change, focal weakness, seizures, loss of consciousness, weakness and headaches.  Endo/Heme/Allergies: Negative for environmental allergies and polydipsia. Does not bruise/bleed easily.  SUBJECTIVE:  Pt states he is still having 5/10 chest pain he has received pain medication   VITAL SIGNS: Temp:  [97.9 F (36.6 C)-98.1 F (36.7 C)] 97.9 F (36.6 C) 09/19/2022 0730) Pulse Rate:  [65-95] 77 September 19, 2022 0845) Resp:  [12-20] 12 09-19-2022 0845) BP: (118-159)/(61-103) 118/90 09/19/2022 0845) SpO2:  [95 %-99 %] 98 % 2022/09/19 0845) Weight:  [113.4 kg (250 lb)-125.9 kg (277 lb 9 oz)] 125.9 kg (277 lb 9 oz) Sep 19, 2022 0730)  PHYSICAL EXAMINATION: General: well developed, well  nourished obese male, NAD  Neuro: alert and oriented, follows commands HEENT: supple, no JVD  Cardiovascular: s1s2,  rrr, no M/R/G Lungs: clear throughout, even, non labored Abdomen: +BS x4, soft, obese, non tender, non distended  Musculoskeletal: normal bulk and tone, no edema  Skin: right groin femstop no hematoma or bleeding, no rashes    Recent Labs Lab 09/05/16 0502  NA 142  K 4.1  CL 104  CO2 28  BUN 15  CREATININE 1.03  GLUCOSE 136*    Recent Labs Lab 09/05/16 0502  HGB 17.0  HCT 48.6  WBC 11.2*  PLT 188   Dg Chest Port 1 View  Result Date: 09/05/2016 CLINICAL DATA:  Shortness of breath and chest pain. EXAM: PORTABLE CHEST 1 VIEW COMPARISON:  None. FINDINGS: Heart is at the upper limits normal in size. Mediastinal contours are normal. The lungs are clear. Pulmonary vasculature is normal. No consolidation, pleural effusion, or pneumothorax. No acute osseous abnormalities are seen. IMPRESSION: Upper normal heart size.  Lungs are clear. Electronically Signed   By: Rubye Oaks M.D.   On: 09/05/2016 05:36    ASSESSMENT / PLAN: Anterior Lateral Wall STEMI s/p cardiac catheterization PCI drug eluting stent placement in the proximal LAD 09/05/16 Right Groin Hematoma-femstop in place  P: Supplemental O2 to maintain O2 sats >92% Continuous telemetry monitoring  Trend troponin's Post Cath EKG  Cardiology consulted appreciate input  Continue aspirin, atorvastatin, plavix, and lisinopril Monitor for s/sx of bleeding Trend CBC Prn oxycodone for pain management Smoking Cessation counseling provided   Sonda Rumble, AGNP  Pulmonary/Critical Care Pager [phone] (please enter 7 digits) PCCM Consult Pager [phone] (please enter 7 digits)

## 2016-09-05 NOTE — Consult Note (Signed)
Reason for Consult: STEMI anterior wall Referring Physician: Dr. Lurline Hare ER physician  Cody Clay is an 52 y.o. male.  HPI: 51 year old obese white male smoker hypertension who started having chest pain and anginal symptoms diaphoresis shortness of breath which initially started a week ago with a moderate episode but went away About 3:30 AM it came back and did not abate 10 out of 10 pain and diaphoresis shortness of breath weakness fatigue finally called rescue brought to emergency room rescue thought by the EKG he was having a STEMI in emergency room EKG was somewhat equivocal but suggested inferolateral ST elevation. No significant reciprocal depression patient was hemoglobin stable still having 8 out of 10 chest pain. Laboratories are still pending but because of his presentation and history was brought to the catheter lab assessment of STEMI. Patient has history of smoking is not on the medication does not follow up with her regular physician denies any previous cardiac history  Past Medical History:  Diagnosis Date  . GERD (gastroesophageal reflux disease)   . Kidney stones     Past Surgical History:  Procedure Laterality Date  . ABDOMINAL SURGERY    . HERNIA REPAIR    . Right knee surgery      No family history on file.  Social History:  reports that he has been smoking.  He has never used smokeless tobacco. He reports that he drinks alcohol. He reports that he does not use drugs.  Allergies: No Known Allergies  Medications: I have reviewed the patient's current medications.  Results for orders placed or performed during the hospital encounter of 09/05/16 (from the past 48 hour(s))  CBC with Differential/Platelet     Status: Abnormal   Collection Time: 09/05/16  5:02 AM  Result Value Ref Range   WBC 11.2 (H) 3.8 - 10.6 K/uL   RBC 5.20 4.40 - 5.90 MIL/uL   Hemoglobin 17.0 13.0 - 18.0 g/dL   HCT 48.6 40.0 - 52.0 %   MCV 93.5 80.0 - 100.0 fL   MCH 32.7 26.0 - 34.0 pg    MCHC 35.0 32.0 - 36.0 g/dL   RDW 14.7 (H) 11.5 - 14.5 %   Platelets 188 150 - 440 K/uL   Neutrophils Relative % 73 %   Neutro Abs 8.3 (H) 1.4 - 6.5 K/uL   Lymphocytes Relative 16 %   Lymphs Abs 1.8 1.0 - 3.6 K/uL   Monocytes Relative 8 %   Monocytes Absolute 0.9 0.2 - 1.0 K/uL   Eosinophils Relative 2 %   Eosinophils Absolute 0.2 0 - 0.7 K/uL   Basophils Relative 1 %   Basophils Absolute 0.1 0 - 0.1 K/uL  Protime-INR     Status: None   Collection Time: 09/05/16  5:02 AM  Result Value Ref Range   Prothrombin Time 13.1 11.4 - 15.2 seconds   INR 0.99   APTT     Status: None   Collection Time: 09/05/16  5:02 AM  Result Value Ref Range   aPTT 25 24 - 36 seconds  Comprehensive metabolic panel     Status: Abnormal   Collection Time: 09/05/16  5:02 AM  Result Value Ref Range   Sodium 142 135 - 145 mmol/L   Potassium 4.1 3.5 - 5.1 mmol/L    Comment: HEMOLYSIS AT THIS LEVEL MAY AFFECT RESULT   Chloride 104 101 - 111 mmol/L   CO2 28 22 - 32 mmol/L   Glucose, Bld 136 (H) 65 - 99 mg/dL  BUN 15 6 - 20 mg/dL   Creatinine, Ser 1.03 0.61 - 1.24 mg/dL   Calcium 9.3 8.9 - 10.3 mg/dL   Total Protein 6.9 6.5 - 8.1 g/dL   Albumin 3.9 3.5 - 5.0 g/dL   AST 41 15 - 41 U/L   ALT 26 17 - 63 U/L   Alkaline Phosphatase 61 38 - 126 U/L   Total Bilirubin 0.6 0.3 - 1.2 mg/dL   GFR calc non Af Amer >60 >60 mL/min   GFR calc Af Amer >60 >60 mL/min    Comment: (NOTE) The eGFR has been calculated using the CKD EPI equation. This calculation has not been validated in all clinical situations. eGFR's persistently <60 mL/min signify possible Chronic Kidney Disease.    Anion gap 10 5 - 15  Troponin I     Status: Abnormal   Collection Time: 09/05/16  5:02 AM  Result Value Ref Range   Troponin I 0.05 (HH) <0.03 ng/mL    Comment: CRITICAL RESULT CALLED TO, READ BACK BY AND VERIFIED WITH SHERRELLE BENNETT AT 0559 ON 09/05/16 [place].   Lipid panel     Status: Abnormal   Collection Time: 09/05/16  5:02 AM   Result Value Ref Range   Cholesterol 197 0 - 200 mg/dL   Triglycerides 712 (H) <150 mg/dL   HDL 46 >40 mg/dL   Total CHOL/HDL Ratio 4.3 RATIO   VLDL UNABLE TO CALCULATE IF TRIGLYCERIDE OVER 400 mg/dL 0 - 40 mg/dL   LDL Cholesterol UNABLE TO CALCULATE IF TRIGLYCERIDE OVER 400 mg/dL 0 - 99 mg/dL    Comment:        Total Cholesterol/HDL:CHD Risk Coronary Heart Disease Risk Table                     Men   Women  1/2 Average Risk   3.4   3.3  Average Risk       5.0   4.4  2 X Average Risk   9.6   7.1  3 X Average Risk  23.4   11.0        Use the calculated Patient Ratio above and the CHD Risk Table to determine the patient's CHD Risk.        ATP III CLASSIFICATION (LDL):  <100     mg/dL   Optimal  100-129  mg/dL   Near or Above                    Optimal  130-159  mg/dL   Borderline  160-189  mg/dL   High  >190     mg/dL   Very High   POCT Activated clotting time     Status: None   Collection Time: 09/05/16  6:06 AM  Result Value Ref Range   Activated Clotting Time 367 seconds    Dg Chest Port 1 View  Result Date: 09/05/2016 CLINICAL DATA:  Shortness of breath and chest pain. EXAM: PORTABLE CHEST 1 VIEW COMPARISON:  None. FINDINGS: Heart is at the upper limits normal in size. Mediastinal contours are normal. The lungs are clear. Pulmonary vasculature is normal. No consolidation, pleural effusion, or pneumothorax. No acute osseous abnormalities are seen. IMPRESSION: Upper normal heart size.  Lungs are clear. Electronically Signed   By: Jeb Levering M.D.   On: 09/05/2016 05:36    Review of Systems  Constitutional: Positive for diaphoresis and malaise/fatigue.  HENT: Positive for congestion.   Eyes: Negative.   Respiratory: Positive  for shortness of breath.   Cardiovascular: Positive for chest pain.  Gastrointestinal: Negative.   Genitourinary: Negative.   Musculoskeletal: Negative.   Skin: Negative.   Neurological: Negative.   Endo/Heme/Allergies: Negative.    Psychiatric/Behavioral: Negative.    Blood pressure 122/61, pulse 86, temperature 98.1 F (36.7 C), temperature source Oral, resp. rate 20, height 6' (1.829 m), weight 113.4 kg (250 lb), SpO2 99 %. Physical Exam  Nursing note and vitals reviewed. Constitutional: He is oriented to person, place, and time. He appears well-developed and well-nourished.  HENT:  Head: Normocephalic and atraumatic.  Eyes: Pupils are equal, round, and reactive to light. Conjunctivae and EOM are normal.  Neck: Normal range of motion. Neck supple.  Cardiovascular: Normal rate and regular rhythm.   Murmur heard. Respiratory: Effort normal and breath sounds normal.  GI: Soft. Bowel sounds are normal.  Musculoskeletal: Normal range of motion.  Neurological: He is alert and oriented to person, place, and time. He has normal reflexes.  Skin: Skin is warm.  Psychiatric: He has a normal mood and affect.    Assessment/Plan: STEMI anterior lateral wall Hypertension Smoking Obesity Abnormal EKG. Hematoma right groin . Plan Status post STEMI presentation cardiac catheter PCI and stent proximal LAD with DES Continue aspirin Plavix for stent anticoagulation Recommend beta blocker ACE inhibitor therapy post MI Continue Lipitor for lipid management Advised patient to refrain from smoking Recommend weight loss exercise portion control Consider echocardiogram for reassessment of left ventricular function and anterior apical hypokinesis Follow-up EKGs and troponins post MI Refer to patient to cardiac rehabilitation Consult hospitalist for management as well as intensive as well A right groin ultrasound evaluation of hematoma Have the patient follow-up with cardiology upon discharge 1-2 weeks  Sebrina Kessner D Lafreda Casebeer 09/05/2016, 7:37 AM

## 2016-09-05 NOTE — Care Management Note (Signed)
Case Management Note  Patient Details  Name: Americus Perkey MRN: [id number] Date of Birth: 1965/02/12  Subjective/Objective:                  RNCM met with patient and his sister to discharge discharge planning needs. Patient's contact number [phone]. His social security number is [phone]. He is independent from home and does not have any ambulatory equipment. He is able to drive. He does not have a PCP. He lives alone. Per sister, he has no income. 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  Patient agrees to referral to [facility] and Medications Management. Patient is aware that he will need to provide proof of financial income for appointment with indigent clinics.   Action/Plan: [facility]. RNCM team to continue to follow.   Expected Discharge Date:                  Expected Discharge Plan:     In-House Referral:     Discharge planning Services  CM Consult, Medication Assistance, [facility]  Post Acute Care Choice:    Choice offered to:  Patient, Sibling  DME Arranged:    DME Agency:     HH Arranged:    [place] Agency:     Status of Service:  In process, will continue to follow  If discussed at Long Length of Stay Meetings, dates discussed:    Additional Comments:  Marshell Garfinkel, RN 09/05/2016, 12:07 PM

## 2016-09-05 NOTE — Progress Notes (Signed)
Pt had a 19 beat run of VTach. Annabelle Harman, NP notified, verbal order for STAT Mag level at this time. Will continue to monitor and assess. Also notified Annabelle Harman, NP that pts Troponin result came back at 1.41. She acknowledged the results no new orders at this time. Paged Cardiology.

## 2016-09-05 NOTE — ED Triage Notes (Signed)
Pt arrives to ED via [facility] \\from  home with c/o sudden "crushing central chest pain" with numbness in the LEFT arm. EMS reports pt with N/V/D x1 week. EMS reports pt was clammy, pale, and diaphoretic upon their arrival and was in tripod position d/t [facility] Pt given 324mg  ASA and 1 does of nasal nitro spray en route. Pt arrives A&O; RR even, regular, and unlabored. Dr Dolores Frame at bedside upon pt's arrival.

## 2016-09-05 NOTE — Progress Notes (Signed)
CH responded to a PG for a Code Stemi in ED-15. The medical team was assessing the Pt upon my arrival. [facility] located the family in the ED waiting area. Family was escorted to the Special Needs waiting area in the Cath Lab unit. The family is calm and spiritually strong. Family's Renato Gails arrived providing additional ministry. MD informed family that a stint was successfully installed.    09/05/16 0506  Clinical Encounter Type  Visited With Patient;Family;Health care provider  Visit Type Initial;Spiritual support;Code;ED  Referral From Nurse  Consult/Referral To Chaplain  Spiritual Encounters  Spiritual Needs Prayer  Advance Directives (For Healthcare)  Does Patient Have a Medical Advance Directive? No  Mental Health Advance Directives  Does Patient Have a Mental Health Advance Directive? No

## 2016-09-05 NOTE — ED Notes (Signed)
Dr. Callwood at bedside at this time.  

## 2016-09-06 ENCOUNTER — Encounter: Payer: Self-pay | Admitting: Internal Medicine

## 2016-09-06 DIAGNOSIS — I213 ST elevation (STEMI) myocardial infarction of unspecified site: Secondary | ICD-10-CM

## 2016-09-06 LAB — BASIC METABOLIC PANEL
ANION GAP: 6 (ref 5–15)
BUN: 16 mg/dL (ref 6–20)
CO2: 29 mmol/L (ref 22–32)
Calcium: 8.3 mg/dL — ABNORMAL LOW (ref 8.9–10.3)
Chloride: 107 mmol/L (ref 101–111)
Creatinine, Ser: 0.96 mg/dL (ref 0.61–1.24)
Glucose, Bld: 118 mg/dL — ABNORMAL HIGH (ref 65–99)
POTASSIUM: 4.9 mmol/L (ref 3.5–5.1)
SODIUM: 142 mmol/L (ref 135–145)

## 2016-09-06 LAB — CBC
HCT: 39.9 % — ABNORMAL LOW (ref 40.0–52.0)
Hemoglobin: 13.7 g/dL (ref 13.0–18.0)
MCH: 32.6 pg (ref 26.0–34.0)
MCHC: 34.3 g/dL (ref 32.0–36.0)
MCV: 94.9 fL (ref 80.0–100.0)
PLATELETS: 148 10*3/uL — AB (ref 150–440)
RBC: 4.21 MIL/uL — AB (ref 4.40–5.90)
RDW: 14.7 % — ABNORMAL HIGH (ref 11.5–14.5)
WBC: 9 10*3/uL (ref 3.8–10.6)

## 2016-09-06 MED ORDER — METOPROLOL TARTRATE 25 MG PO TABS
12.5000 mg | ORAL_TABLET | Freq: Two times a day (BID) | ORAL | Status: DC
  Administered 2016-09-06 – 2016-09-07 (×2): 12.5 mg via ORAL
  Filled 2016-09-06 (×2): qty 1

## 2016-09-06 MED ORDER — METHYLPREDNISOLONE SODIUM SUCC 125 MG IJ SOLR
60.0000 mg | Freq: Four times a day (QID) | INTRAMUSCULAR | Status: DC
  Administered 2016-09-06 – 2016-09-07 (×5): 60 mg via INTRAVENOUS
  Filled 2016-09-06 (×5): qty 2

## 2016-09-06 MED ORDER — IPRATROPIUM-ALBUTEROL 0.5-2.5 (3) MG/3ML IN SOLN: 3.0000 mL | Freq: Four times a day (QID) | RESPIRATORY_TRACT | Status: DC | PRN

## 2016-09-06 MED ORDER — KETOROLAC TROMETHAMINE 30 MG/ML IJ SOLN
30.0000 mg | Freq: Once | INTRAMUSCULAR | Status: AC
  Administered 2016-09-07: 30 mg via INTRAVENOUS
  Filled 2016-09-06: qty 1

## 2016-09-06 MED ORDER — IPRATROPIUM-ALBUTEROL 0.5-2.5 (3) MG/3ML IN SOLN
3.0000 mL | Freq: Four times a day (QID) | RESPIRATORY_TRACT | Status: DC
  Administered 2016-09-06 (×2): 3 mL via RESPIRATORY_TRACT
  Filled 2016-09-06 (×2): qty 3

## 2016-09-06 MED ORDER — LISINOPRIL 5 MG PO TABS: 2.5000 mg | ORAL_TABLET | Freq: Two times a day (BID) | ORAL | Status: DC

## 2016-09-06 NOTE — Progress Notes (Signed)
Paged Dr. Anne Hahn about pt's pain maintaining around 5-6. New order for Toradol, see emar.

## 2016-09-06 NOTE — Progress Notes (Signed)
Patient asleep during shift.  No significant changes.    Lab results reported to covering provider.  Magnesium given and tolerated per NP order. Right AC access accidentally removed by patient.   Patient has complaints of pain in right femoral artery.  No evidence of hematoma noted.  Difficult to palpate as patient reports 8-9 pain/tenderness in that area.  Patient given PRN pain med at this time.  Cardiology in with patient at this time.

## 2016-09-06 NOTE — Progress Notes (Signed)
[facility] [facility] [place] [facility]   PATIENT NAME: Cody Clay    MR#:  [id number]  DATE OF BIRTH:  1965/09/22  SUBJECTIVE:  CHIEF COMPLAINT:   Chief Complaint  Patient presents with  . Chest Pain  . Shortness of Breath   No chest pain today. Has chest congestion and wheezing.  REVIEW OF SYSTEMS:    Review of Systems  Constitutional: Positive for malaise/fatigue. Negative for chills and fever.  HENT: Negative for sore throat.   Eyes: Negative for blurred vision, double vision and pain.  Respiratory: Positive for cough, sputum production and wheezing. Negative for hemoptysis and shortness of breath.   Cardiovascular: Negative for chest pain, palpitations, orthopnea and leg swelling.  Gastrointestinal: Negative for abdominal pain, constipation, diarrhea, heartburn, nausea and vomiting.  Genitourinary: Negative for dysuria and hematuria.  Musculoskeletal: Negative for back pain and joint pain.  Skin: Negative for rash.  Neurological: Negative for sensory change, speech change, focal weakness and headaches.  Endo/Heme/Allergies: Does not bruise/bleed easily.  Psychiatric/Behavioral: Negative for depression. The patient is not nervous/anxious.     DRUG ALLERGIES:  No Known Allergies  VITALS:  Blood pressure 117/70, pulse 62, temperature 98.1 F (36.7 C), temperature source Oral, resp. rate 20, height 6' (1.829 m), weight 125.9 kg (277 lb 9 oz), SpO2 93 %.  PHYSICAL EXAMINATION:   Physical Exam  GENERAL:  51 y.o.-year-old patient lying in the bed with no acute distress.  EYES: Pupils equal, round, reactive to light and accommodation. No scleral icterus. Extraocular muscles intact.  HEENT: Head atraumatic, normocephalic. Oropharynx and nasopharynx clear.  NECK:  Supple, no jugular venous distention. No thyroid enlargement, no tenderness.  LUNGS: Normal breath sounds bilaterally, no wheezing, rales, rhonchi. No use of accessory muscles of respiration.   CARDIOVASCULAR: S1, S2 normal. No murmurs, rubs, or gallops.  ABDOMEN: Soft, nontender, nondistended. Bowel sounds present. No organomegaly or mass.  EXTREMITIES: No cyanosis, clubbing or edema b/l.    NEUROLOGIC: Cranial nerves II through XII are intact. No focal Motor or sensory deficits b/l.   PSYCHIATRIC: The patient is alert and oriented x 3.  SKIN: No obvious rash, lesion, or ulcer.   LABORATORY PANEL:   CBC  Recent Labs Lab 09/06/16 0451  WBC 9.0  HGB 13.7  HCT 39.9*  PLT 148*   ------------------------------------------------------------------------------------------------------------------ Chemistries   Recent Labs Lab 09/05/16 0502 09/05/16 1730 09/06/16 0451  NA 142  --  142  K 4.1  --  4.9  CL 104  --  107  CO2 28  --  29  GLUCOSE 136*  --  118*  BUN 15  --  16  CREATININE 1.03  --  0.96  CALCIUM 9.3  --  8.3*  MG 2.0 1.8  --   AST 41  --   --   ALT 26  --   --   ALKPHOS 61  --   --   BILITOT 0.6  --   --    ------------------------------------------------------------------------------------------------------------------  Cardiac Enzymes  Recent Labs Lab 09/05/16 2248  TROPONINI 5.12*   ------------------------------------------------------------------------------------------------------------------  RADIOLOGY:  [place] Lower Ext Art Right Ltd  Result Date: 09/05/2016 CLINICAL DATA:  51 year old male with a history of cardiac catheterization and hemorrhage. EXAM: UNILATERAL RIGHT LOWER EXTREMITY ARTERIAL DUPLEX SCAN TECHNIQUE: Gray-scale sonography as well as color Doppler and duplex ultrasound was performed to evaluate the arteries of the lower extremity. COMPARISON:  None. FINDINGS: Patent right common femoral artery with triphasic waveform. No pseudoaneurysm identified. IMPRESSION:  Patent right common femoral artery with triphasic waveform and no evidence of pseudoaneurysm. Electronically Signed   By: Gilmer Mor D.O.   On: 09/05/2016 16:51   Dg  Chest Port 1 View  Result Date: 09/05/2016 CLINICAL DATA:  Shortness of breath and chest pain. EXAM: PORTABLE CHEST 1 VIEW COMPARISON:  None. FINDINGS: Heart is at the upper limits normal in size. Mediastinal contours are normal. The lungs are clear. Pulmonary vasculature is normal. No consolidation, pleural effusion, or pneumothorax. No acute osseous abnormalities are seen. IMPRESSION: Upper normal heart size.  Lungs are clear. Electronically Signed   By: Rubye Oaks M.D.   On: 09/05/2016 05:36     ASSESSMENT AND PLAN:   * ST elevation MI with occlusion of LAD Status post-drug eluting stent. On aspirin, Plavix, statin. Metoprolol and ACE inhibitor. [facility] clinic cardiology following the patient  * acute bronchitis. Prednisone. Nebs When necessary.  * sinus bradycardia due to metoprolol. We will reduce doseto 12.5 twice a day.  * Right groin hematoma. No bleeding. Ultrasound showed no pseudoaneurysm.  * Hypertension. On metoprolol and lisinopril  * Tobacco abuse. Counseled patient to quit smoking on admission.  All the records are reviewed and case discussed with Care Management/Social Worker Management plans discussed with the patient, family and they are in agreement.  CODE STATUS: FULL CODE  DVT Prophylaxis: SCDs  TOTAL TIME TAKING CARE OF THIS PATIENT: 30 minutes.   POSSIBLE D/C IN 1-2 DAYS, DEPENDING ON CLINICAL CONDITION.  Milagros Loll R M.D on 09/06/2016 at 1:39 PM  Between 7am to 6pm - Pager - [phone]  After 6pm go to [email or web address] - password [email or web address]  [facility] [place] [facility]  Office  [phone]  CC: Primary care physician; Patient, No Pcp Per  Note: This dictation was prepared with Dragon dictation along with smaller phrase technology. Any transcriptional errors that result from this process are unintentional.

## 2016-09-06 NOTE — Progress Notes (Signed)
Pt's HR is in the upper 50s (55-59) this AM. BP is also slightly soft at 106/62 (74). He is due for his 50mg  PO metoprolol. Per Dr. Lonn Georgia we can hold this dose due to pt's VS.

## 2016-09-06 NOTE — Progress Notes (Signed)
[facility] Cardiology [facility] Encounter Note  Patient: Cody Clay / Admit Date: 09/05/2016 / Date of Encounter: 09/06/2016, 6:53 AM   Subjective: Patient somewhat weak today but improved from yesterday. No evidence of significant chest pain or true angina. Patient breathing well. Patient did have some idioventricular rhythm yesterday now clear  Review of Systems: Positive for: Weakness Negative for: Vision change, hearing change, syncope, dizziness, nausea, vomiting,diarrhea, bloody stool, stomach pain, cough, congestion, diaphoresis, urinary frequency, urinary pain,skin lesions, skin rashes Others previously listed  Objective: Telemetry: Normal sinus rhythm Physical Exam: Blood pressure 115/77, pulse (!) 57, temperature 98.8 F (37.1 C), temperature source Oral, resp. rate 17, height 6' (1.829 m), weight 125.9 kg (277 lb 9 oz), SpO2 96 %. Body mass index is 37.64 kg/m. General: Well developed, well nourished, in no acute distress. Head: Normocephalic, atraumatic, sclera non-icteric, no xanthomas, nares are without discharge. Neck: No apparent masses Lungs: Normal respirations with no wheezes, no rhonchi, no rales , no crackles   Heart: Regular rate and rhythm, normal S1 S2, no murmur, no rub, no gallop, PMI is normal size and placement, carotid upstroke normal without bruit, jugular venous pressure normal Abdomen: Soft, non-tender, non-distended with normoactive bowel sounds. No hepatosplenomegaly. Abdominal aorta is normal size without bruit Extremities: No edema, no clubbing, no cyanosis, no ulcers,  Peripheral: 2+ radial, 2+ femoral, 2+ dorsal pedal pulses Neuro: Alert and oriented. Moves all extremities spontaneously. Psych:  Responds to questions appropriately with a normal affect.   Intake/Output Summary (Last 24 hours) at 09/06/16 0653 Last data filed at 09/06/16 0400  Gross per 24 hour  Intake          1621.07 ml  Output             1425 ml  Net           196.07 ml     Inpatient Medications:  . aspirin  81 mg Oral Daily  . atorvastatin  80 mg Oral q1800  . clopidogrel  75 mg Oral Q breakfast  . heparin  4,000 Units Intravenous Once  . lisinopril  5 mg Oral BID  . metoprolol tartrate  50 mg Oral BID  . sodium chloride flush  3 mL Intravenous Q12H   Infusions:  . sodium chloride    . sodium chloride      Labs:  Recent Labs  09/05/16 0502 09/05/16 1730 09/06/16 0451  NA 142  --  142  K 4.1  --  4.9  CL 104  --  107  CO2 28  --  29  GLUCOSE 136*  --  118*  BUN 15  --  16  CREATININE 1.03  --  0.96  CALCIUM 9.3  --  8.3*  MG 2.0 1.8  --     Recent Labs  09/05/16 0502  AST 41  ALT 26  ALKPHOS 61  BILITOT 0.6  PROT 6.9  ALBUMIN 3.9    Recent Labs  09/05/16 0502 09/06/16 0451  WBC 11.2* 9.0  NEUTROABS 8.3*  --   HGB 17.0 13.7  HCT 48.6 39.9*  MCV 93.5 94.9  PLT 188 148*    Recent Labs  09/05/16 0502 09/05/16 1125 09/05/16 1730 09/05/16 2248  TROPONINI 0.05* 1.41* 4.31* 5.12*   Invalid input(s): POCBNP No results for input(s): HGBA1C in the last 72 hours.   Weights: Filed Weights   09/05/16 0505 09/05/16 0730  Weight: 113.4 kg (250 lb) 125.9 kg (277 lb 9 oz)  Radiology/Studies:  [place] Lower Ext Art Right Ltd  Result Date: 09/05/2016 CLINICAL DATA:  51 year old male with a history of cardiac catheterization and hemorrhage. EXAM: UNILATERAL RIGHT LOWER EXTREMITY ARTERIAL DUPLEX SCAN TECHNIQUE: Gray-scale sonography as well as color Doppler and duplex ultrasound was performed to evaluate the arteries of the lower extremity. COMPARISON:  None. FINDINGS: Patent right common femoral artery with triphasic waveform. No pseudoaneurysm identified. IMPRESSION: Patent right common femoral artery with triphasic waveform and no evidence of pseudoaneurysm. Electronically Signed   By: Gilmer Mor D.O.   On: 09/05/2016 16:51   Dg Chest Port 1 View  Result Date: 09/05/2016 CLINICAL DATA:  Shortness of breath and chest  pain. EXAM: PORTABLE CHEST 1 VIEW COMPARISON:  None. FINDINGS: Heart is at the upper limits normal in size. Mediastinal contours are normal. The lungs are clear. Pulmonary vasculature is normal. No consolidation, pleural effusion, or pneumothorax. No acute osseous abnormalities are seen. IMPRESSION: Upper normal heart size.  Lungs are clear. Electronically Signed   By: Rubye Oaks M.D.   On: 09/05/2016 05:36     Assessment and Recommendation  51 y.o. male with the significant ST elevation myocardial infarction status post PCI and stent placement improved at this time 1. Continue high intensity cholesterol therapy with atorvastatin 2. Dual antiplatelet therapy 3. Okay for transfer to floor for further in-hospital rehabilitation 4. Echocardiogram for LV systolic dysfunction 5. Further rehabilitation and possible discharged home Sunday  Signed, Arnoldo Hooker M.D. FACC

## 2016-09-06 NOTE — Progress Notes (Signed)
[facility] Medicine Progess Note    SYNOPSIS   Patient status post STEMI, status post cardiac catheterization requiring proximal LAD drug-eluting stent secondary to 99% stenosis.   ASSESSMENT/PLAN   Status post STEMI, PCI with proximal LAD drug-eluting stent. Doing well. We'll follow cardiology recommendations He has been hemodynamically stable without any arrhythmias and should be able to go to a monitored bed today.  Diffuse wheezing appreciated. Will start on albuterol, Atrovent and a short burst of systemic steroids.   INTAKE / OUTPUT:  Intake/Output Summary (Last 24 hours) at 09/06/16 0737 Last data filed at 09/06/16 0400  Gross per 24 hour  Intake          1621.07 ml  Output              725 ml  Net           896.07 ml    Name: Cody Clay MRN: [id number] DOB: 01-13-1966    ADMISSION DATE:  09/05/2016   SUBJECTIVE:   Over the last 24 hours patient has been complaining of some increasing shortness of breath and wheezing. Denies any leg pain or chest pain  VITAL SIGNS: Temp:  [98.2 F (36.8 C)-98.8 F (37.1 C)] 98.3 F (36.8 C) (08/25 0736) Pulse Rate:  [53-80] 57 (08/25 0630) Resp:  [10-21] 17 (08/25 0630) BP: (79-151)/(53-117) 115/77 (08/25 0630) SpO2:  [92 %-99 %] 96 % (08/25 0630)   PHYSICAL EXAMINATION: Physical Examination:   VS: BP 115/77   Pulse (!) 57   Temp 98.3 F (36.8 C) (Oral)   Resp 17   Ht 6' (1.829 m)   Wt 125.9 kg (277 lb 9 oz)   SpO2 96%   BMI 37.64 kg/m   General Appearance: No distress  Neuro:without focal findings, mental status normal. HEENT: PERRLA, EOM intact. Pulmonary: Diffuse wheezing on exam appreciated  CardiovascularNormal S1,S2.  No m/r/g.   Abdomen: Benign, Soft, non-tender. Extremities: no cyanosis on the right leg, palpable pulses, warm    LABORATORY PANEL:   CBC  Recent Labs Lab 09/06/16 0451  WBC 9.0  HGB 13.7  HCT 39.9*  PLT 148*    Chemistries   Recent Labs Lab  09/05/16 0502 09/05/16 1730 09/06/16 0451  NA 142  --  142  K 4.1  --  4.9  CL 104  --  107  CO2 28  --  29  GLUCOSE 136*  --  118*  BUN 15  --  16  CREATININE 1.03  --  0.96  CALCIUM 9.3  --  8.3*  MG 2.0 1.8  --   AST 41  --   --   ALT 26  --   --   ALKPHOS 61  --   --   BILITOT 0.6  --   --      Recent Labs Lab 09/05/16 0723  GLUCAP 142*   No results for input(s): PHART, PCO2ART, PO2ART in the last 168 hours.  Recent Labs Lab 09/05/16 0502  AST 41  ALT 26  ALKPHOS 61  BILITOT 0.6  ALBUMIN 3.9    Cardiac Enzymes  Recent Labs Lab 09/05/16 2248  TROPONINI 5.12*    RADIOLOGY:  [place] Lower Ext Art Right Ltd  Result Date: 09/05/2016 CLINICAL DATA:  51 year old male with a history of cardiac catheterization and hemorrhage. EXAM: UNILATERAL RIGHT LOWER EXTREMITY ARTERIAL DUPLEX SCAN TECHNIQUE: Gray-scale sonography as well as color Doppler and duplex ultrasound was performed to evaluate the arteries of the lower  extremity. COMPARISON:  None. FINDINGS: Patent right common femoral artery with triphasic waveform. No pseudoaneurysm identified. IMPRESSION: Patent right common femoral artery with triphasic waveform and no evidence of pseudoaneurysm. Electronically Signed   By: Gilmer Mor D.O.   On: 09/05/2016 16:51   Dg Chest Port 1 View  Result Date: 09/05/2016 CLINICAL DATA:  Shortness of breath and chest pain. EXAM: PORTABLE CHEST 1 VIEW COMPARISON:  None. FINDINGS: Heart is at the upper limits normal in size. Mediastinal contours are normal. The lungs are clear. Pulmonary vasculature is normal. No consolidation, pleural effusion, or pneumothorax. No acute osseous abnormalities are seen. IMPRESSION: Upper normal heart size.  Lungs are clear. Electronically Signed   By: Rubye Oaks M.D.   On: 09/05/2016 05:36    Tora Kindred, DO 09/06/2016

## 2016-09-06 NOTE — Progress Notes (Signed)
Pt transferring to 2A this morning. Pt/family is aware and agreeable to transfer. Assessment is unchanged from this morning and receiving RN has been given report. Belongings sent with pt at bedside.

## 2016-09-07 MED ORDER — ATORVASTATIN CALCIUM 80 MG PO TABS
80.0000 mg | ORAL_TABLET | Freq: Every day | ORAL | 0 refills | Status: DC
Start: 2016-09-07 — End: 2017-02-09

## 2016-09-07 MED ORDER — METOPROLOL TARTRATE 25 MG PO TABS: 12.5000 mg | ORAL_TABLET | Freq: Two times a day (BID) | ORAL | 0 refills | Status: DC

## 2016-09-07 MED ORDER — ASPIRIN 81 MG PO CHEW: 81.0000 mg | CHEWABLE_TABLET | Freq: Every day | ORAL | 0 refills | Status: DC

## 2016-09-07 MED ORDER — CLOPIDOGREL BISULFATE 75 MG PO TABS: 75.0000 mg | ORAL_TABLET | Freq: Every day | ORAL | 0 refills | Status: DC

## 2016-09-07 MED ORDER — PREDNISONE 20 MG PO TABS: 20.0000 mg | ORAL_TABLET | Freq: Every day | ORAL | 0 refills | Status: DC

## 2016-09-07 MED ORDER — TRAMADOL HCL 50 MG PO TABS: 50.0000 mg | ORAL_TABLET | Freq: Four times a day (QID) | ORAL | 0 refills | Status: DC | PRN

## 2016-09-07 MED ORDER — LISINOPRIL 2.5 MG PO TABS: 2.5000 mg | ORAL_TABLET | Freq: Two times a day (BID) | ORAL | 0 refills | Status: DC

## 2016-09-07 NOTE — Care Management Note (Signed)
Case Management Note  Patient Details  Name: Cody Clay MRN: [id number] Date of Birth: 08-13-1965  Subjective/Objective:   Provided uninsured Mr An[place]ondo with a MATCH program certificate, a list of local medical clinics which serve the uninsured, and an application to the [facility] and the [facility]. Wife repeated back instructions about how to use the [facility] coupon and has a list of participating pharmacies.                  Action/Plan:   Expected Discharge Date:  09/07/16               Expected Discharge Plan:  Home/Self Care  In-House Referral:  NA  Discharge planning Services  CM Consult, Medication Assistance, [facility], [place]  Post Acute Care Choice:  NA Choice offered to:  Sibling  DME Arranged:  N/A DME Agency:  NA  HH Arranged:  NA HH Agency:  NA  Status of Service:  Completed, signed off  If discussed at Long Length of Stay Meetings, dates discussed:    Additional Comments:  Rachel Samples A, RN 09/07/2016, 12:15 PM

## 2016-09-07 NOTE — Progress Notes (Signed)
Pt. Had multiple request for pain medication throughout the night, medication was given with very little affect. Pt did not appears to be in stated pain when assessed.

## 2016-09-07 NOTE — Discharge Instructions (Signed)
Heart healthy diet  Activity - No exertion or lifting heavy weights.  Stay off work till you see the cadiologist

## 2016-09-07 NOTE — Progress Notes (Signed)
[facility] Cardiology [facility] Encounter Note  Patient: Cody Clay / Admit Date: 09/05/2016 / Date of Encounter: 09/07/2016, 8:00 AM   Subjective:   No evidence of significant chest pain or true angina. Patient breathing well.   Review of Systems: Positive for: None Negative for: Vision change, hearing change, syncope, dizziness, nausea, vomiting,diarrhea, bloody stool, stomach pain, cough, congestion, diaphoresis, urinary frequency, urinary pain,skin lesions, skin rashes Others previously listed  Objective: Telemetry: Normal sinus rhythm Physical Exam: Blood pressure 138/89, pulse (!) 59, temperature 97.8 F (36.6 C), resp. rate 16, height 6' (1.829 m), weight 125.9 kg (277 lb 9 oz), SpO2 93 %. Body mass index is 37.64 kg/m. General: Well developed, well nourished, in no acute distress. Head: Normocephalic, atraumatic, sclera non-icteric, no xanthomas, nares are without discharge. Neck: No apparent masses Lungs: Normal respirations with no wheezes, no rhonchi, no rales , no crackles   Heart: Regular rate and rhythm, normal S1 S2, no murmur, no rub, no gallop, PMI is normal size and placement, carotid upstroke normal without bruit, jugular venous pressure normal Abdomen: Soft, non-tender, non-distended with normoactive bowel sounds. No hepatosplenomegaly. Abdominal aorta is normal size without bruit Extremities: No edema, no clubbing, no cyanosis, no ulcers,  Peripheral: 2+ radial, 2+ femoral, 2+ dorsal pedal pulses Neuro: Alert and oriented. Moves all extremities spontaneously. Psych:  Responds to questions appropriately with a normal affect.   Intake/Output Summary (Last 24 hours) at 09/07/16 0800 Last data filed at 09/06/16 1825  Gross per 24 hour  Intake              240 ml  Output              425 ml  Net             -185 ml    Inpatient Medications:  . aspirin  81 mg Oral Daily  . atorvastatin  80 mg Oral q1800  . clopidogrel  75 mg Oral Q breakfast  . heparin   4,000 Units Intravenous Once  . lisinopril  2.5 mg Oral BID  . methylPREDNISolone (SOLU-MEDROL) injection  60 mg Intravenous Q6H  . metoprolol tartrate  12.5 mg Oral BID  . sodium chloride flush  3 mL Intravenous Q12H   Infusions:  . sodium chloride    . sodium chloride      Labs:  Recent Labs  09/05/16 0502 09/05/16 1730 09/06/16 0451  NA 142  --  142  K 4.1  --  4.9  CL 104  --  107  CO2 28  --  29  GLUCOSE 136*  --  118*  BUN 15  --  16  CREATININE 1.03  --  0.96  CALCIUM 9.3  --  8.3*  MG 2.0 1.8  --     Recent Labs  09/05/16 0502  AST 41  ALT 26  ALKPHOS 61  BILITOT 0.6  PROT 6.9  ALBUMIN 3.9    Recent Labs  09/05/16 0502 09/06/16 0451  WBC 11.2* 9.0  NEUTROABS 8.3*  --   HGB 17.0 13.7  HCT 48.6 39.9*  MCV 93.5 94.9  PLT 188 148*    Recent Labs  09/05/16 0502 09/05/16 1125 09/05/16 1730 09/05/16 2248  TROPONINI 0.05* 1.41* 4.31* 5.12*   Invalid input(s): POCBNP No results for input(s): HGBA1C in the last 72 hours.   Weights: Filed Weights   09/05/16 0505 09/05/16 0730  Weight: 113.4 kg (250 lb) 125.9 kg (277 lb 9 oz)  Radiology/Studies:  [place] Lower Ext Art Right Ltd  Result Date: 09/05/2016 CLINICAL DATA:  51 year old male with a history of cardiac catheterization and hemorrhage. EXAM: UNILATERAL RIGHT LOWER EXTREMITY ARTERIAL DUPLEX SCAN TECHNIQUE: Gray-scale sonography as well as color Doppler and duplex ultrasound was performed to evaluate the arteries of the lower extremity. COMPARISON:  None. FINDINGS: Patent right common femoral artery with triphasic waveform. No pseudoaneurysm identified. IMPRESSION: Patent right common femoral artery with triphasic waveform and no evidence of pseudoaneurysm. Electronically Signed   By: Gilmer Mor D.O.   On: 09/05/2016 16:51   Dg Chest Port 1 View  Result Date: 09/05/2016 CLINICAL DATA:  Shortness of breath and chest pain. EXAM: PORTABLE CHEST 1 VIEW COMPARISON:  None. FINDINGS: Heart is at  the upper limits normal in size. Mediastinal contours are normal. The lungs are clear. Pulmonary vasculature is normal. No consolidation, pleural effusion, or pneumothorax. No acute osseous abnormalities are seen. IMPRESSION: Upper normal heart size.  Lungs are clear. Electronically Signed   By: Rubye Oaks M.D.   On: 09/05/2016 05:36     Assessment and Recommendation  51 y.o. male with the significant ST elevation myocardial infarction status post PCI and stent placement improved at this time 1. Continue high intensity cholesterol therapy with atorvastatin 2. Dual antiplatelet therapy 3. Continue rehabilitation 4. If ambulating well okay for discharge to home with follow-up in one to 2 weeks  Signed, Arnoldo Hooker M.D. FACC

## 2016-09-09 NOTE — Discharge Summary (Signed)
[facility] [place] [facility]   PATIENT NAME: Cody Clay    MR#:  [id number]  DATE OF BIRTH:  03-04-1965  DATE OF ADMISSION:  09/05/2016 ADMITTING PHYSICIAN: Alwyn Pea, MD  DATE OF DISCHARGE: 09/07/2016 12:00 PM  PRIMARY CARE PHYSICIAN: Patient, No Pcp Per   ADMISSION DIAGNOSIS:  Chest pain, unspecified type [R07.9] STEMI (ST elevation myocardial infarction) (HCC) [I21.3]  DISCHARGE DIAGNOSIS:  Active Problems:   STEMI (ST elevation myocardial infarction) (HCC)   SECONDARY DIAGNOSIS:   Past Medical History:  Diagnosis Date  . GERD (gastroesophageal reflux disease)   . Kidney stones      ADMITTING HISTORY  HISTORY OF PRESENT ILLNESS:  Rickey Wachsmuth  is a 51 y.o. male with a known history of Hypertension, tobacco use present to the emergency room with acute onset of chest pain. He was found to have ST elevation MI and was rushed to the cardiac catheterization lab. Mid LAD occlusion was found then has drug eluting stent in place. Complicated by a hematoma and a FemoStop is in place.  HOSPITAL COURSE:   * ST elevation MI with occlusion of LAD Status post-drug eluting stent. On aspirin, Plavix, statin. Metoprolol and Lisinopril [facility] clinic cardiology following the patient Follow-up with Dr. Juliann Pares within 1 week. Cardiac rehabilitation referable  * acute bronchitis. Prednisone. Nebs When necessary. Discharge home on prednisone.  * sinus bradycardia due to metoprolol.  Asymptomatic. Reduced dose of metoprolol.  * Right groin hematoma. No bleeding. Ultrasound showed no pseudoaneurysm.  * Hypertension. On metoprolol and lisinopril  * Tobacco abuse. Counseled patient to quit smoking on admission.  Patient is stable for discharge home with follow-up with cardiology and cardiac rehabilitation.  Cardiac Discharge   Aspirin prescribed at discharge:  Yes  High Intensity Statin Prescribed? (Lipitor 40-80mg  or Crestor 20-40mg ):  Yes  Beta Blocker Prescribed: Yes  ADP Receptor Inhibitor Prescribed? (i.e. Plavix etc.-Includes Medically Managed Patients): Yes  ACEI/ARB Prescribed? (If NO document contraindications)  Yes  Aldosterone Inhibitor Prescribed? No:  AS OP if BP tolerates  Was EF assessed during THIS hospitalization? Yes  (YES = Measured in current episode of care or document plan to evaluate after discharge.)  Was EF < 40% ? No:   Was Cardiac Rehab II ordered? (Includes Medically managed Patients): Yes  Was Smoking Cessation Advice provided?  Yes  CONSULTS OBTAINED:  Treatment Team:  Lamar Blinks, MD  DRUG ALLERGIES:  No Known Allergies  DISCHARGE MEDICATIONS:   Discharge Medication List as of 09/07/2016 11:52 AM    START taking these medications   Details  aspirin 81 MG chewable tablet Chew 1 tablet (81 mg total) by mouth daily., Starting Mon 09/08/2016, OTC    atorvastatin (LIPITOR) 80 MG tablet Take 1 tablet (80 mg total) by mouth daily at 6 PM., Starting Sun 09/07/2016, Print    clopidogrel (PLAVIX) 75 MG tablet Take 1 tablet (75 mg total) by mouth daily with breakfast., Starting Mon 09/08/2016, Print    lisinopril (PRINIVIL,ZESTRIL) 2.5 MG tablet Take 1 tablet (2.5 mg total) by mouth 2 (two) times daily., Starting Sun 09/07/2016, Normal    metoprolol tartrate (LOPRESSOR) 25 MG tablet Take 0.5 tablets (12.5 mg total) by mouth 2 (two) times daily., Starting Sun 09/07/2016, Normal    predniSONE (DELTASONE) 20 MG tablet Take 1 tablet (20 mg total) by mouth daily with breakfast., Starting Sun 09/07/2016, Normal    traMADol (ULTRAM) 50 MG tablet Take 1 tablet (50 mg total) by mouth every  6 (six) hours as needed for severe pain., Starting Sun 09/07/2016, Until Mon 09/07/2017, Normal        Today   VITAL SIGNS:  Blood pressure 127/78, pulse 61, temperature 97.6 F (36.4 C), temperature source Oral, resp. rate 18, height 6' (1.829 m), weight 125.9 kg (277 lb 9 oz), SpO2 95 %.  I/O:   No intake or output data in the 24 hours ending 09/09/16 1450  PHYSICAL EXAMINATION:  Physical Exam  GENERAL:  50 y.o.-year-old patient lying in the bed with no acute distress.  LUNGS: Normal breath sounds bilaterally, no wheezing, rales,rhonchi or crepitation. No use of accessory muscles of respiration.  CARDIOVASCULAR: S1, S2 normal. No murmurs, rubs, or gallops.  ABDOMEN: Soft, non-tender, non-distended. Bowel sounds present. No organomegaly or mass.  NEUROLOGIC: Moves all 4 extremities. PSYCHIATRIC: The patient is alert and oriented x 3.  SKIN: No obvious rash, lesion, or ulcer.   DATA REVIEW:   CBC  Recent Labs Lab 09/06/16 0451  WBC 9.0  HGB 13.7  HCT 39.9*  PLT 148*    Chemistries   Recent Labs Lab 09/05/16 0502 09/05/16 1730 09/06/16 0451  NA 142  --  142  K 4.1  --  4.9  CL 104  --  107  CO2 28  --  29  GLUCOSE 136*  --  118*  BUN 15  --  16  CREATININE 1.03  --  0.96  CALCIUM 9.3  --  8.3*  MG 2.0 1.8  --   AST 41  --   --   ALT 26  --   --   ALKPHOS 61  --   --   BILITOT 0.6  --   --     Cardiac Enzymes  Recent Labs Lab 09/05/16 2248  TROPONINI 5.12*    Microbiology Results  Results for orders placed or performed during the hospital encounter of 09/05/16  MRSA PCR Screening     Status: None   Collection Time: 09/05/16  9:24 AM  Result Value Ref Range Status   MRSA by PCR NEGATIVE NEGATIVE Final    Comment:        The GeneXpert MRSA Assay (FDA approved for NASAL specimens only), is one component of a comprehensive MRSA colonization surveillance program. It is not intended to diagnose MRSA infection nor to guide or monitor treatment for MRSA infections.   Gastrointestinal Panel by PCR , Stool     Status: None   Collection Time: 09/05/16  2:34 PM  Result Value Ref Range Status   Campylobacter species NOT DETECTED NOT DETECTED Final   Plesimonas shigelloides NOT DETECTED NOT DETECTED Final   Salmonella species NOT DETECTED NOT  DETECTED Final   Yersinia enterocolitica NOT DETECTED NOT DETECTED Final   Vibrio species NOT DETECTED NOT DETECTED Final   Vibrio cholerae NOT DETECTED NOT DETECTED Final   Enteroaggregative E coli (EAEC) NOT DETECTED NOT DETECTED Final   Enteropathogenic E coli (EPEC) NOT DETECTED NOT DETECTED Final   Enterotoxigenic E coli (ETEC) NOT DETECTED NOT DETECTED Final   Shiga like toxin producing E coli (STEC) NOT DETECTED NOT DETECTED Final   Shigella/Enteroinvasive E coli (EIEC) NOT DETECTED NOT DETECTED Final   Cryptosporidium NOT DETECTED NOT DETECTED Final   Cyclospora cayetanensis NOT DETECTED NOT DETECTED Final   Entamoeba histolytica NOT DETECTED NOT DETECTED Final   Giardia lamblia NOT DETECTED NOT DETECTED Final   Adenovirus F40/41 NOT DETECTED NOT DETECTED Final   Astrovirus NOT DETECTED NOT DETECTED  Final   Norovirus GI/GII NOT DETECTED NOT DETECTED Final   Rotavirus A NOT DETECTED NOT DETECTED Final   Sapovirus (I, II, IV, and V) NOT DETECTED NOT DETECTED Final    RADIOLOGY:  No results found.  Follow up with PCP in 1 week.  Management plans discussed with the patient, family and they are in agreement.  CODE STATUS:  Code Status History    Date Active Date Inactive Code Status Order ID Comments User Context   09/05/2016  7:34 AM 09/07/2016  4:10 PM Full Code [id number]  Alwyn Pea, MD Inpatient      TOTAL TIME TAKING CARE OF THIS PATIENT ON DAY OF DISCHARGE: more than 30 minutes.   Milagros Loll R M.D on 09/09/2016 at 2:50 PM  Between 7am to 6pm - Pager - [phone]  After 6pm go to [email or web address] - password [email or web address]  [facility]  Office  [phone]  CC: Primary care physician; Patient, No Pcp Per  Note: This dictation was prepared with Dragon dictation along with smaller phrase technology. Any transcriptional errors that result from this process are unintentional.

## 2016-09-17 ENCOUNTER — Other Ambulatory Visit: Payer: Self-pay | Admitting: Internal Medicine

## 2016-09-17 DIAGNOSIS — I208 Other forms of angina pectoris: Secondary | ICD-10-CM

## 2016-09-18 ENCOUNTER — Other Ambulatory Visit: Payer: Self-pay | Admitting: Internal Medicine

## 2016-09-18 DIAGNOSIS — R0602 Shortness of breath: Secondary | ICD-10-CM

## 2016-09-18 DIAGNOSIS — I208 Other forms of angina pectoris: Secondary | ICD-10-CM

## 2016-09-19 ENCOUNTER — Telehealth: Payer: Self-pay | Admitting: Pharmacy Technician

## 2016-09-19 NOTE — Telephone Encounter (Signed)
Provided patient with new patient packet.  Betty J. Kluttz Care Manager [facility] 

## 2016-09-24 ENCOUNTER — Encounter
Admission: RE | Admit: 2016-09-24 | Discharge: 2016-09-24 | Disposition: A | Discharge status: Home / Self Care | Payer: Self-pay | Source: Ambulatory Visit | Attending: Internal Medicine | Admitting: Internal Medicine

## 2016-09-24 DIAGNOSIS — I208 Other forms of angina pectoris: Secondary | ICD-10-CM | POA: Insufficient documentation

## 2016-09-24 DIAGNOSIS — I2089 Other forms of angina pectoris: Secondary | ICD-10-CM

## 2016-09-30 ENCOUNTER — Ambulatory Visit: Payer: Self-pay

## 2016-10-01 ENCOUNTER — Encounter: Admission: RE | Admit: 2016-10-01 | Payer: Self-pay | Source: Ambulatory Visit

## 2016-10-03 ENCOUNTER — Encounter
Admission: RE | Admit: 2016-10-03 | Discharge: 2016-10-03 | Disposition: A | Discharge status: Home / Self Care | Payer: Self-pay | Source: Ambulatory Visit | Attending: Internal Medicine | Admitting: Internal Medicine

## 2016-10-03 DIAGNOSIS — I208 Other forms of angina pectoris: Secondary | ICD-10-CM | POA: Insufficient documentation

## 2016-10-03 LAB — NM MYOCAR MULTI W/SPECT W/WALL MOTION / EF
CHL CUP NUCLEAR SDS: 3
CHL CUP NUCLEAR SRS: 13
CHL CUP NUCLEAR SSS: 12
CHL CUP RESTING HR STRESS: 71 {beats}/min
CSEPEW: 8.1 METS
CSEPPHR: 127 {beats}/min
Exercise duration (min): 7 min
Exercise duration (sec): 38 s
LV dias vol: 137 mL (ref 62–150)
LV sys vol: 48 mL
MPHR: 170 {beats}/min
NUC STRESS TID: 0.89
Percent HR: 74 %

## 2016-10-03 MED ORDER — TECHNETIUM TC 99M TETROFOSMIN IV KIT
30.0000 | PACK | Freq: Once | INTRAVENOUS | Status: AC | PRN
  Administered 2016-10-03: 30 via INTRAVENOUS

## 2016-10-03 MED ORDER — TECHNETIUM TC 99M TETROFOSMIN IV KIT
12.4600 | PACK | Freq: Once | INTRAVENOUS | Status: AC | PRN
  Administered 2016-10-03: 12.46 via INTRAVENOUS

## 2016-10-13 ENCOUNTER — Emergency Department
Admission: EM | Admit: 2016-10-13 | Discharge: 2016-10-13 | Disposition: A | Discharge status: Home / Self Care | Payer: Self-pay | Attending: Student in an Organized Health Care Education/Training Program | Admitting: Student in an Organized Health Care Education/Training Program

## 2016-10-13 ENCOUNTER — Emergency Department: Payer: Self-pay

## 2016-10-13 ENCOUNTER — Encounter: Payer: Self-pay | Admitting: Emergency Medicine

## 2016-10-13 DIAGNOSIS — Z79899 Other long term (current) drug therapy: Secondary | ICD-10-CM | POA: Insufficient documentation

## 2016-10-13 DIAGNOSIS — F17200 Nicotine dependence, unspecified, uncomplicated: Secondary | ICD-10-CM | POA: Insufficient documentation

## 2016-10-13 DIAGNOSIS — Z7982 Long term (current) use of aspirin: Secondary | ICD-10-CM | POA: Insufficient documentation

## 2016-10-13 DIAGNOSIS — I252 Old myocardial infarction: Secondary | ICD-10-CM | POA: Insufficient documentation

## 2016-10-13 DIAGNOSIS — J40 Bronchitis, not specified as acute or chronic: Secondary | ICD-10-CM

## 2016-10-13 DIAGNOSIS — R0602 Shortness of breath: Secondary | ICD-10-CM | POA: Insufficient documentation

## 2016-10-13 HISTORY — DX: Acute myocardial infarction, unspecified: I21.9

## 2016-10-13 LAB — BASIC METABOLIC PANEL
Anion gap: 9 (ref 5–15)
BUN: 20 mg/dL (ref 6–20)
CALCIUM: 8.8 mg/dL — AB (ref 8.9–10.3)
CO2: 26 mmol/L (ref 22–32)
CREATININE: 1.1 mg/dL (ref 0.61–1.24)
Chloride: 103 mmol/L (ref 101–111)
Glucose, Bld: 125 mg/dL — ABNORMAL HIGH (ref 65–99)
Potassium: 4.4 mmol/L (ref 3.5–5.1)
Sodium: 138 mmol/L (ref 135–145)

## 2016-10-13 LAB — CBC
HCT: 43.6 % (ref 40.0–52.0)
Hemoglobin: 15.1 g/dL (ref 13.0–18.0)
MCH: 32.2 pg (ref 26.0–34.0)
MCHC: 34.6 g/dL (ref 32.0–36.0)
MCV: 93 fL (ref 80.0–100.0)
PLATELETS: 191 10*3/uL (ref 150–440)
RBC: 4.69 MIL/uL (ref 4.40–5.90)
RDW: 13.2 % (ref 11.5–14.5)
WBC: 9.5 10*3/uL (ref 3.8–10.6)

## 2016-10-13 LAB — PROTIME-INR
INR: 0.98
Prothrombin Time: 12.9 seconds (ref 11.4–15.2)

## 2016-10-13 LAB — TROPONIN I: Troponin I: <0.03 ng/mL (ref <0.03)

## 2016-10-13 LAB — FIBRIN DERIVATIVES D-DIMER (ARMC ONLY): FIBRIN DERIVATIVES D-DIMER (ARMC): 314.86 ng{FEU}/mL (ref 0.00–499.00)

## 2016-10-13 MED ORDER — IPRATROPIUM-ALBUTEROL 0.5-2.5 (3) MG/3ML IN SOLN
3.0000 mL | Freq: Once | RESPIRATORY_TRACT | Status: AC
  Administered 2016-10-13: 3 mL via RESPIRATORY_TRACT
  Filled 2016-10-13: qty 3

## 2016-10-13 MED ORDER — SODIUM CHLORIDE 0.9 % IV BOLUS (SEPSIS)
500.0000 mL | Freq: Once | INTRAVENOUS | Status: AC
  Administered 2016-10-13: 500 mL via INTRAVENOUS

## 2016-10-13 MED ORDER — LORAZEPAM 1 MG PO TABS
1.0000 mg | ORAL_TABLET | Freq: Once | ORAL | Status: AC
  Administered 2016-10-13: 1 mg via ORAL
  Filled 2016-10-13: qty 1

## 2016-10-13 MED ORDER — METHYLPREDNISOLONE SODIUM SUCC 125 MG IJ SOLR
125.0000 mg | Freq: Once | INTRAMUSCULAR | Status: AC
  Administered 2016-10-13: 125 mg via INTRAVENOUS
  Filled 2016-10-13: qty 2

## 2016-10-13 MED ORDER — ALBUTEROL SULFATE HFA 108 (90 BASE) MCG/ACT IN AERS: 2.0000 | INHALATION_SPRAY | Freq: Four times a day (QID) | RESPIRATORY_TRACT | 2 refills | Status: DC | PRN

## 2016-10-13 MED ORDER — PREDNISONE 10 MG PO TABS: 10.0000 mg | ORAL_TABLET | Freq: Every day | ORAL | 0 refills | Status: DC

## 2016-10-13 MED ORDER — AEROCHAMBER MV MISC: 0 refills | Status: DC

## 2016-10-13 NOTE — ED Notes (Signed)
Pt verbalizes d/c teachign and follow up. Pt given RX at d/c. Pt VS stable, ambulatory at d/c . Pt unable to sign due to signature malfnx

## 2016-10-13 NOTE — ED Triage Notes (Signed)
Pt presents with central chest pain and shortness of breath started last night. Pt with stent placed last mth by Dr Juliann Pares.

## 2016-10-13 NOTE — ED Notes (Signed)
Pt 92-94% on RA while ambulating, pt in NAD denies any SOB. MD made aware

## 2016-10-13 NOTE — ED Provider Notes (Signed)
[facility] Emergency Department Provider Note    None    (approximate)  I have reviewed the triage vital signs and the nursing notes.   HISTORY  Chief Complaint Chest Pain    HPI Cody Clay. is a 51 y.o. male roughly a month and a half status post cardiac catheter STEMI presents with shortness of breath, productive cough and midsternal chest pain associated with taking a deep breath since last night. States he was discharged home on steroids and had significant improvement in his breathing and then when he stopped the steroids he had worsening shortness of breath that became more severe last night. States that this pain feels very different from his heart attack. Denies any orthopnea. He's been compliant with his medications but still smokes a pack a day.  Denies any diaphoresis nausea or vomiting. No lower extremity swelling.  Cath report 09/05/16:  There is mild left ventricular systolic dysfunction.  LV end diastolic pressure is normal.  The left ventricular ejection fraction is 50-55% by visual estimate.  A STENT XIENCE ALPINE RX 4.0X28 drug eluting stent was successfully placed, and does not overlap previously placed stent.  Prox LAD to Mid LAD lesion, 99 %stenosed.  Post intervention, there is a 0% residual stenosis.  Ost 1st Diag lesion, 90 %stenosed. STEMI Diagnostic catheterization with 99% proximal LAD lesion mild to moderate thrombus Left dominant system Successful PCI and stent to the proximal LAD with DES reducing lesion from 99 down to 0% Borderline reduced left ventricular function ejection fraction of 50%      Past Medical History:  Diagnosis Date  . GERD (gastroesophageal reflux disease)   . Kidney stones   . MI (myocardial infarction) (HCC)    No family history on file. Past Surgical History:  Procedure Laterality Date  . ABDOMINAL SURGERY    . CORONARY/GRAFT ACUTE MI REVASCULARIZATION N/A 09/05/2016   Procedure: Coronary/Graft Acute MI Revascularization;  Surgeon: Alwyn Pea, MD;  Location: [facility] INVASIVE CV LAB;  Service: Cardiovascular;  Laterality: N/A;  prox lad  . HERNIA REPAIR    . LEFT HEART CATH AND CORONARY ANGIOGRAPHY N/A 09/05/2016   Procedure: LEFT HEART CATH AND CORONARY ANGIOGRAPHY;  Surgeon: Alwyn Pea, MD;  Location: [facility] INVASIVE CV LAB;  Service: Cardiovascular;  Laterality: N/A;  . Right knee surgery     Patient Active Problem List   Diagnosis Date Noted  . STEMI (ST elevation myocardial infarction) (HCC) 09/05/2016      Prior to Admission medications   Medication Sig Start Date End Date Taking? Authorizing Provider  aspirin 81 MG chewable tablet Chew 1 tablet (81 mg total) by mouth daily. 09/08/16  Yes Milagros Loll, MD  atorvastatin (LIPITOR) 80 MG tablet Take 1 tablet (80 mg total) by mouth daily at 6 PM. 09/07/16  Yes Sudini, Wardell Heath, MD  clopidogrel (PLAVIX) 75 MG tablet Take 1 tablet (75 mg total) by mouth daily with breakfast. 09/08/16  Yes Sudini, Wardell Heath, MD  lisinopril (PRINIVIL,ZESTRIL) 2.5 MG tablet Take 1 tablet (2.5 mg total) by mouth 2 (two) times daily. 09/07/16  Yes Milagros Loll, MD  metoprolol tartrate (LOPRESSOR) 25 MG tablet Take 0.5 tablets (12.5 mg total) by mouth 2 (two) times daily. 09/07/16  Yes Sudini, Wardell Heath, MD  albuterol (PROVENTIL HFA;VENTOLIN HFA) 108 (90 Base) MCG/ACT inhaler Inhale 2 puffs into the lungs every 6 (six) hours as needed for wheezing or shortness of breath. 10/13/16   Willy Eddy, MD  predniSONE (DELTASONE) 10 MG  tablet Take 1 tablet (10 mg total) by mouth daily. Day 1-2: Take 50 mg  ( 5 pills) Day 3-4 : Take 40 mg (4pills) Day 5-6: Take 30 mg (3 pills) Day 7-8:  Take 20 mg (2 pills) Day 9:  Take  (1 pill) 10/13/16   Willy Eddy, MD  predniSONE (DELTASONE) 20 MG tablet Take 1 tablet (20 mg total) by mouth daily with breakfast. Patient not taking: Reported on 10/13/2016 09/07/16   Milagros Loll, MD    Spacer/Aero-Holding Chambers (AEROCHAMBER MV) inhaler Use as instructed 10/13/16   Willy Eddy, MD  traMADol (ULTRAM) 50 MG tablet Take 1 tablet (50 mg total) by mouth every 6 (six) hours as needed for severe pain. Patient not taking: Reported on 10/13/2016 09/07/16 09/07/17  Milagros Loll, MD    Allergies Patient has no known allergies.    Social History Social History  Substance Use Topics  . Smoking status: Current Every Day Smoker  . Smokeless tobacco: Never Used  . Alcohol use Yes    Review of Systems Patient denies headaches, rhinorrhea, blurry vision, numbness, shortness of breath, chest pain, edema, cough, abdominal pain, nausea, vomiting, diarrhea, dysuria, fevers, rashes or hallucinations unless otherwise stated above in HPI. ____________________________________________   PHYSICAL EXAM:  VITAL SIGNS: Vitals:   10/13/16 1430 10/13/16 1500  BP: 105/71 117/78  Pulse: (!) 55 (!) 59  Resp: 12 18  Temp:    SpO2: 96% 91%    Constitutional: Alert and oriented. Well appearing and in no acute distress. Eyes: Conjunctivae are normal.  Head: Atraumatic. Nose: No congestion/rhinnorhea. Mouth/Throat: Mucous membranes are moist.   Neck: No stridor. Painless ROM.  Cardiovascular: Normal rate, regular rhythm. Grossly normal heart sounds.  Good peripheral circulation. Respiratory: Normal respiratory effort.  No retractions. Lungs with diffuse inspiratory and expiratory wheeze Gastrointestinal: Soft and nontender. No distention. No abdominal bruits. No CVA tenderness. Genitourinary:  Musculoskeletal: No lower extremity tenderness nor edema.  No joint effusions. Neurologic:  Normal speech and language. No gross focal neurologic deficits are appreciated. No facial droop Skin:  Skin is warm, dry and intact. No rash noted. Psychiatric: Mood and affect are normal. Speech and behavior are normal.  ____________________________________________   LABS (all labs ordered are  listed, but only abnormal results are displayed)  Results for orders placed or performed during the hospital encounter of 10/13/16 (from the past 24 hour(s))  Basic metabolic panel     Status: Abnormal   Collection Time: 10/13/16 12:07 PM  Result Value Ref Range   Sodium 138 135 - 145 mmol/L   Potassium 4.4 3.5 - 5.1 mmol/L   Chloride 103 101 - 111 mmol/L   CO2 26 22 - 32 mmol/L   Glucose, Bld 125 (H) 65 - 99 mg/dL   BUN 20 6 - 20 mg/dL   Creatinine, Ser 4.09 0.61 - 1.24 mg/dL   Calcium 8.8 (L) 8.9 - 10.3 mg/dL   GFR calc non Af Amer >60 >60 mL/min   GFR calc Af Amer >60 >60 mL/min   Anion gap 9 5 - 15  CBC     Status: None   Collection Time: 10/13/16 12:07 PM  Result Value Ref Range   WBC 9.5 3.8 - 10.6 K/uL   RBC 4.69 4.40 - 5.90 MIL/uL   Hemoglobin 15.1 13.0 - 18.0 g/dL   HCT 81.1 91.4 - 78.2 %   MCV 93.0 80.0 - 100.0 fL   MCH 32.2 26.0 - 34.0 pg   MCHC 34.6 32.0 -  36.0 g/dL   RDW 78.2 95.6 - 21.3 %   Platelets 191 150 - 440 K/uL  Troponin I     Status: None   Collection Time: 10/13/16 12:07 PM  Result Value Ref Range   Troponin I <0.03 <0.03 ng/mL  Protime-INR (order if Patient is taking Coumadin / Warfarin)     Status: None   Collection Time: 10/13/16 12:07 PM  Result Value Ref Range   Prothrombin Time 12.9 11.4 - 15.2 seconds   INR 0.98   Fibrin derivatives D-Dimer ([facility] only)     Status: None   Collection Time: 10/13/16 12:07 PM  Result Value Ref Range   Fibrin derivatives D-dimer ([facility]) 314.86 0.00 - 499.00 ng/mL (FEU)  Troponin I     Status: None   Collection Time: 10/13/16  2:45 PM  Result Value Ref Range   Troponin I <0.03 <0.03 ng/mL   ____________________________________________  EKG My review and personal interpretation at Time: 12:03   Indication: chest pain  Rate: 65  Rhythm: sinus Axis: normal Other: normal intervals, no stemi, non specific st changes ____________________________________________  RADIOLOGY  I personally reviewed all  radiographic images ordered to evaluate for the above acute complaints and reviewed radiology reports and findings.  These findings were personally discussed with the patient.  Please see medical record for radiology report.  ____________________________________________   PROCEDURES  Procedure(s) performed:  Procedures    Critical Care performed: no ____________________________________________   INITIAL IMPRESSION / ASSESSMENT AND PLAN / ED COURSE  Pertinent labs & imaging results that were available during my care of the patient were reviewed by me and considered in my medical decision making (see chart for details).  DDX: Asthma, copd, CHF, pna, ptx, malignancy, Pe, anemia   Terik Haughey. is a 51 y.o. who presents to the ED with shortness of breath and chest pain as described above. EKG shows no evidence of ischemia. Initial troponin is negative. Patient states pain is not similar to his heart attack. Does have wheezing on exam and is suggestive of COPD versus bronchitis. Chest x-ray ordered for the above differential shows no evidence of congestive heart failure or consolidation. He is low risk by well's and d-dimer will be sent to further risk stratify for pulmonary embolism.  I will provide nebulizer treatment as well as steroids and reassessed.  Clinical Course as of Oct 14 1538  Mon Oct 13, 2016  1355 I spoke with Dr. Gwen Pounds cardiology regarding the patient's presentation. I do believe that he is clinically consistent with a bronchitis exacerbation. Less consistent with ACS. Cardiology does feel couple fallowing patient in clinic this week assuming that repeat troponin is negative.  D-dimer is negative.  Will continue nebulizer treatments and reassessment.  [PR]  1501 patient reassessed with significant improvement in his respirations. Breath sounds audible throughout. Decreased wheezing. Patient states that shortness of breath has significantly resolved. Denies any chest  pain.  [PR]  1537 patient able to ambulate with a steady gait with no signs of hypoxia, respiratory distress or chest pain. Patient will be discharged home on breathing treatments and steroid taper with follow-up with cardiology.  10 minutes of smoking cessation counseling provided..  Have discussed with the patient and available family all diagnostics and treatments performed thus far and all questions were answered to the best of my ability. The patient demonstrates understanding and agreement with plan.   [PR]    Clinical Course User Index [PR] Willy Eddy, MD  ____________________________________________   FINAL CLINICAL IMPRESSION(S) / ED DIAGNOSES  Final diagnoses:  Shortness of breath      NEW MEDICATIONS STARTED DURING THIS VISIT:  New Prescriptions   ALBUTEROL (PROVENTIL HFA;VENTOLIN HFA) 108 (90 BASE) MCG/ACT INHALER    Inhale 2 puffs into the lungs every 6 (six) hours as needed for wheezing or shortness of breath.   PREDNISONE (DELTASONE) 10 MG TABLET    Take 1 tablet (10 mg total) by mouth daily. Day 1-2: Take 50 mg  ( 5 pills) Day 3-4 : Take 40 mg (4pills) Day 5-6: Take 30 mg (3 pills) Day 7-8:  Take 20 mg (2 pills) Day 9:  Take  (1 pill)   SPACER/AERO-HOLDING CHAMBERS (AEROCHAMBER MV) INHALER    Use as instructed     Note:  This document was prepared using Dragon voice recognition software and may include unintentional dictation errors.    Willy Eddy, MD 10/13/16 [phone]

## 2016-10-31 ENCOUNTER — Ambulatory Visit: Payer: Self-pay

## 2016-11-10 ENCOUNTER — Ambulatory Visit: Payer: Self-pay | Admitting: Pharmacy Technician

## 2016-11-10 DIAGNOSIS — Z79899 Other long term (current) drug therapy: Secondary | ICD-10-CM

## 2016-11-10 NOTE — Progress Notes (Addendum)
Met with patient completed financial assistance application for  due to recent hospital visit.  Patient agreed to be responsible for gathering financial information and forwarding to appropriate department in [facility].    Completed [facility] application and contract.  Patient agreed to all terms of the [facility] contract.  Patient approved to receive medication assistance at [facility] through 2018, as long as eligibility criteria continues to be met.    Provided patient with community resource material based on her particular needs.    Referred patient to [facility] and [employer].  Patient stated that he is currently seeing Dr. Clayborn Bigness.  Wants to continue to see Dr. Clayborn Bigness instead of going to [facility] or [facility].  [facility]

## 2016-12-15 ENCOUNTER — Ambulatory Visit: Payer: Self-pay

## 2016-12-15 ENCOUNTER — Telehealth: Payer: Self-pay

## 2016-12-15 NOTE — Telephone Encounter (Signed)
LVM for patient to call back to schedule an appointment with [facility].  Patient was referred from [facility].

## 2016-12-25 ENCOUNTER — Ambulatory Visit: Payer: Self-pay

## 2016-12-25 ENCOUNTER — Encounter: Payer: Self-pay | Admitting: Pharmacist

## 2017-01-20 ENCOUNTER — Telehealth: Payer: Self-pay | Admitting: Pharmacist

## 2017-01-20 NOTE — Telephone Encounter (Signed)
01/20/17 I am mailing a denial letter to patient for Ventolin HFA due to no script from provider to order this med. We sent script 11/10/16 to Dr. Juliann Paresallwood to sign, received call from his office 11/21/16 that "patient not seen" provider not signed. Lelon MastSamantha has called patient 11/24/16, someone picked up phone and hung up, she called back 11/26/16 and left a message for patient to call us. We have not received a call back. 12/03/16 patient's sister in office asking about this med, explained that we sent to Dr. Juliann Paresallwood and his office stating patient not seen, she stated he has seen Dr. Juliann Paresallwood & has upcoming appointment, I printed a script and gave her to get signed & return to us. I have not received back. [facility] 12/15/16 left message for patient to call for appt. appears not appt. has been made. Unable to order Ventolin until we receive a script.Forde RadonAJ

## 2017-02-09 ENCOUNTER — Observation Stay
Admission: EM | Admit: 2017-02-09 | Discharge: 2017-02-09 | Disposition: A | Discharge status: Home / Self Care | Payer: Self-pay | Attending: Internal Medicine | Admitting: Internal Medicine

## 2017-02-09 ENCOUNTER — Encounter: Payer: Self-pay | Admitting: Internal Medicine

## 2017-02-09 ENCOUNTER — Emergency Department: Payer: Self-pay

## 2017-02-09 ENCOUNTER — Other Ambulatory Visit: Payer: Self-pay

## 2017-02-09 DIAGNOSIS — I25110 Atherosclerotic heart disease of native coronary artery with unstable angina pectoris: Secondary | ICD-10-CM | POA: Insufficient documentation

## 2017-02-09 DIAGNOSIS — Z7951 Long term (current) use of inhaled steroids: Secondary | ICD-10-CM | POA: Insufficient documentation

## 2017-02-09 DIAGNOSIS — Z9114 Patient's other noncompliance with medication regimen: Secondary | ICD-10-CM | POA: Insufficient documentation

## 2017-02-09 DIAGNOSIS — Z87442 Personal history of urinary calculi: Secondary | ICD-10-CM | POA: Insufficient documentation

## 2017-02-09 DIAGNOSIS — Z6835 Body mass index (BMI) 35.0-35.9, adult: Secondary | ICD-10-CM | POA: Insufficient documentation

## 2017-02-09 DIAGNOSIS — Z9119 Patient's noncompliance with other medical treatment and regimen: Secondary | ICD-10-CM | POA: Insufficient documentation

## 2017-02-09 DIAGNOSIS — E669 Obesity, unspecified: Secondary | ICD-10-CM | POA: Insufficient documentation

## 2017-02-09 DIAGNOSIS — Z8249 Family history of ischemic heart disease and other diseases of the circulatory system: Secondary | ICD-10-CM | POA: Insufficient documentation

## 2017-02-09 DIAGNOSIS — I252 Old myocardial infarction: Secondary | ICD-10-CM | POA: Insufficient documentation

## 2017-02-09 DIAGNOSIS — F419 Anxiety disorder, unspecified: Principal | ICD-10-CM | POA: Insufficient documentation

## 2017-02-09 DIAGNOSIS — F17200 Nicotine dependence, unspecified, uncomplicated: Secondary | ICD-10-CM | POA: Insufficient documentation

## 2017-02-09 DIAGNOSIS — E782 Mixed hyperlipidemia: Secondary | ICD-10-CM | POA: Insufficient documentation

## 2017-02-09 DIAGNOSIS — R079 Chest pain, unspecified: Secondary | ICD-10-CM | POA: Insufficient documentation

## 2017-02-09 DIAGNOSIS — Z7902 Long term (current) use of antithrombotics/antiplatelets: Secondary | ICD-10-CM | POA: Insufficient documentation

## 2017-02-09 DIAGNOSIS — Z955 Presence of coronary angioplasty implant and graft: Secondary | ICD-10-CM | POA: Insufficient documentation

## 2017-02-09 DIAGNOSIS — Z7982 Long term (current) use of aspirin: Secondary | ICD-10-CM | POA: Insufficient documentation

## 2017-02-09 DIAGNOSIS — N179 Acute kidney failure, unspecified: Secondary | ICD-10-CM | POA: Insufficient documentation

## 2017-02-09 DIAGNOSIS — K219 Gastro-esophageal reflux disease without esophagitis: Secondary | ICD-10-CM | POA: Insufficient documentation

## 2017-02-09 DIAGNOSIS — I10 Essential (primary) hypertension: Secondary | ICD-10-CM | POA: Insufficient documentation

## 2017-02-09 DIAGNOSIS — Z79899 Other long term (current) drug therapy: Secondary | ICD-10-CM | POA: Insufficient documentation

## 2017-02-09 LAB — CBC WITH DIFFERENTIAL/PLATELET
Basophils Absolute: 0.1 10*3/uL (ref 0–0.1)
Basophils Relative: 1 %
Eosinophils Absolute: 0.1 10*3/uL (ref 0–0.7)
Eosinophils Relative: 2 %
HCT: 47.3 % (ref 40.0–52.0)
HEMOGLOBIN: 16.1 g/dL (ref 13.0–18.0)
LYMPHS PCT: 19 %
Lymphs Abs: 1.8 10*3/uL (ref 1.0–3.6)
MCH: 31.1 pg (ref 26.0–34.0)
MCHC: 34 g/dL (ref 32.0–36.0)
MCV: 91.3 fL (ref 80.0–100.0)
Monocytes Absolute: 0.6 10*3/uL (ref 0.2–1.0)
Monocytes Relative: 6 %
NEUTROS PCT: 72 %
Neutro Abs: 6.7 10*3/uL — ABNORMAL HIGH (ref 1.4–6.5)
Platelets: 190 10*3/uL (ref 150–440)
RBC: 5.18 MIL/uL (ref 4.40–5.90)
RDW: 13.8 % (ref 11.5–14.5)
WBC: 9.3 10*3/uL (ref 3.8–10.6)

## 2017-02-09 LAB — COMPREHENSIVE METABOLIC PANEL
ALBUMIN: 3.6 g/dL (ref 3.5–5.0)
ALK PHOS: 63 U/L (ref 38–126)
ALT: 25 U/L (ref 17–63)
ANION GAP: 10 (ref 5–15)
AST: 27 U/L (ref 15–41)
BUN: 22 mg/dL — AB (ref 6–20)
CALCIUM: 8.4 mg/dL — AB (ref 8.9–10.3)
CO2: 19 mmol/L — AB (ref 22–32)
Chloride: 107 mmol/L (ref 101–111)
Creatinine, Ser: 1.35 mg/dL — ABNORMAL HIGH (ref 0.61–1.24)
GFR calc Af Amer: >60 mL/min (ref >60)
GFR calc non Af Amer: 59 mL/min — ABNORMAL LOW (ref >60)
GLUCOSE: 156 mg/dL — AB (ref 65–99)
POTASSIUM: 4 mmol/L (ref 3.5–5.1)
SODIUM: 136 mmol/L (ref 135–145)
Total Bilirubin: 0.7 mg/dL (ref 0.3–1.2)
Total Protein: 6.5 g/dL (ref 6.5–8.1)

## 2017-02-09 LAB — TSH: TSH: 1.402 u[IU]/mL (ref 0.350–4.500)

## 2017-02-09 LAB — TROPONIN I
Troponin I: <0.03 ng/mL (ref <0.03)
Troponin I: <0.03 ng/mL (ref <0.03)

## 2017-02-09 LAB — LIPID PANEL
Cholesterol: 186 mg/dL (ref 0–200)
HDL: 55 mg/dL (ref >40)
LDL Cholesterol: 98 mg/dL (ref 0–99)
Total CHOL/HDL Ratio: 3.4 RATIO
Triglycerides: 166 mg/dL — ABNORMAL HIGH (ref <150)
VLDL: 33 mg/dL (ref 0–40)

## 2017-02-09 LAB — HEMOGLOBIN A1C
Hgb A1c MFr Bld: 6.2 % — ABNORMAL HIGH (ref 4.8–5.6)
Mean Plasma Glucose: 131.24 mg/dL

## 2017-02-09 LAB — BRAIN NATRIURETIC PEPTIDE: B Natriuretic Peptide: 20 pg/mL (ref 0.0–100.0)

## 2017-02-09 MED ORDER — ONDANSETRON HCL 4 MG PO TABS: 4.0000 mg | ORAL_TABLET | Freq: Four times a day (QID) | ORAL | Status: DC | PRN

## 2017-02-09 MED ORDER — METOPROLOL TARTRATE 25 MG PO TABS: 12.5000 mg | ORAL_TABLET | Freq: Two times a day (BID) | ORAL | 0 refills | Status: DC

## 2017-02-09 MED ORDER — LORAZEPAM 1 MG PO TABS
ORAL_TABLET | ORAL | Status: AC
  Administered 2017-02-09: 1 mg via ORAL
  Filled 2017-02-09: qty 1

## 2017-02-09 MED ORDER — ASPIRIN 81 MG PO CHEW: 81.0000 mg | CHEWABLE_TABLET | Freq: Every day | ORAL | 0 refills | Status: DC

## 2017-02-09 MED ORDER — NITROGLYCERIN 2 % TD OINT
TOPICAL_OINTMENT | TRANSDERMAL | Status: AC
  Administered 2017-02-09: 0.5 [in_us] via TOPICAL
  Filled 2017-02-09: qty 1

## 2017-02-09 MED ORDER — ONDANSETRON HCL 4 MG/2ML IJ SOLN: 4.0000 mg | Freq: Four times a day (QID) | INTRAMUSCULAR | Status: DC | PRN

## 2017-02-09 MED ORDER — ONDANSETRON HCL 4 MG/2ML IJ SOLN
INTRAMUSCULAR | Status: AC
  Filled 2017-02-09: qty 2

## 2017-02-09 MED ORDER — SODIUM CHLORIDE 0.9 % IV SOLN
INTRAVENOUS | Status: DC
  Administered 2017-02-09: 06:00:00 via INTRAVENOUS

## 2017-02-09 MED ORDER — DOCUSATE SODIUM 100 MG PO CAPS
100.0000 mg | ORAL_CAPSULE | Freq: Two times a day (BID) | ORAL | Status: DC
  Administered 2017-02-09: 100 mg via ORAL
  Filled 2017-02-09: qty 1

## 2017-02-09 MED ORDER — CLOPIDOGREL BISULFATE 75 MG PO TABS: 75.0000 mg | ORAL_TABLET | Freq: Every day | ORAL | 0 refills | Status: DC

## 2017-02-09 MED ORDER — CLOPIDOGREL BISULFATE 75 MG PO TABS
75.0000 mg | ORAL_TABLET | Freq: Every day | ORAL | Status: DC
  Administered 2017-02-09: 75 mg via ORAL
  Filled 2017-02-09: qty 1

## 2017-02-09 MED ORDER — ACETAMINOPHEN 650 MG RE SUPP: 650.0000 mg | Freq: Four times a day (QID) | RECTAL | Status: DC | PRN

## 2017-02-09 MED ORDER — NITROGLYCERIN 2 % TD OINT
0.5000 [in_us] | TOPICAL_OINTMENT | TRANSDERMAL | Status: AC
  Administered 2017-02-09: 0.5 [in_us] via TOPICAL

## 2017-02-09 MED ORDER — ALPRAZOLAM 1 MG PO TABS: 1.0000 mg | ORAL_TABLET | Freq: Every evening | ORAL | 0 refills | Status: AC | PRN

## 2017-02-09 MED ORDER — HYDROCOD POLST-CPM POLST ER 10-8 MG/5ML PO SUER: 5.0000 mL | Freq: Every evening | ORAL | Status: DC | PRN

## 2017-02-09 MED ORDER — ASPIRIN 81 MG PO CHEW
81.0000 mg | CHEWABLE_TABLET | Freq: Every day | ORAL | Status: DC
  Administered 2017-02-09: 81 mg via ORAL
  Filled 2017-02-09: qty 1

## 2017-02-09 MED ORDER — ONDANSETRON HCL 4 MG/2ML IJ SOLN
4.0000 mg | Freq: Once | INTRAMUSCULAR | Status: AC
  Administered 2017-02-09: 4 mg via INTRAVENOUS

## 2017-02-09 MED ORDER — MORPHINE SULFATE (PF) 2 MG/ML IV SOLN
2.0000 mg | INTRAVENOUS | Status: DC | PRN
  Administered 2017-02-09 (×2): 2 mg via INTRAVENOUS
  Filled 2017-02-09 (×2): qty 1

## 2017-02-09 MED ORDER — BUPROPION HCL 75 MG PO TABS
75.0000 mg | ORAL_TABLET | Freq: Two times a day (BID) | ORAL | Status: DC
  Administered 2017-02-09: 75 mg via ORAL
  Filled 2017-02-09 (×2): qty 1

## 2017-02-09 MED ORDER — ACETAMINOPHEN 325 MG PO TABS: 650.0000 mg | ORAL_TABLET | Freq: Four times a day (QID) | ORAL | Status: DC | PRN

## 2017-02-09 MED ORDER — LORAZEPAM 1 MG PO TABS
1.0000 mg | ORAL_TABLET | ORAL | Status: AC
  Administered 2017-02-09: 1 mg via ORAL

## 2017-02-09 MED ORDER — ATORVASTATIN CALCIUM 80 MG PO TABS: 80.0000 mg | ORAL_TABLET | Freq: Every day | ORAL | 0 refills | Status: DC

## 2017-02-09 MED ORDER — LISINOPRIL 2.5 MG PO TABS: 2.5000 mg | ORAL_TABLET | Freq: Two times a day (BID) | ORAL | 0 refills | Status: DC

## 2017-02-09 MED ORDER — LORAZEPAM 2 MG/ML IJ SOLN
1.0000 mg | Freq: Once | INTRAMUSCULAR | Status: AC
  Administered 2017-02-09: 1 mg via INTRAVENOUS
  Filled 2017-02-09: qty 1

## 2017-02-09 MED ORDER — BUPROPION HCL 75 MG PO TABS: 75.0000 mg | ORAL_TABLET | Freq: Two times a day (BID) | ORAL | 0 refills | Status: DC

## 2017-02-09 MED ORDER — ENOXAPARIN SODIUM 40 MG/0.4ML ~~LOC~~ SOLN: 40.0000 mg | SUBCUTANEOUS | Status: DC

## 2017-02-09 MED ORDER — METOPROLOL TARTRATE 25 MG PO TABS
12.5000 mg | ORAL_TABLET | Freq: Two times a day (BID) | ORAL | Status: DC
  Administered 2017-02-09: 12.5 mg via ORAL
  Filled 2017-02-09: qty 1

## 2017-02-09 NOTE — ED Provider Notes (Signed)
[facility] Emergency Department Provider Note  ____________________________________________   First MD Initiated Contact with Patient 02/09/17 [phone]     (approximate)  I have reviewed the triage vital signs and the nursing notes.   HISTORY  Chief Complaint Chest Pain   HPI Cody Clay. is a 52 y.o. male who comes to the emergency department via EMS with substernal pressure-like chest pain that awoke him from sleep roughly half hour prior to arrival.  The pain is moderate to severe in his left chest radiating across to his left upper arm.  Associated with nausea but no vomiting.  Associated with shortness of breath.  The pain is constant.  He took 324 mg of aspirin and Plavix prior to calling EMS.  EMS gave him 1 spray of nitroglycerin which improved his symptoms.  About 5 months ago he had a STEMI with catheterization below.  He does report compliance with his medications.  09/05/16 Cath: There is mild left ventricular systolic dysfunction.  LV end diastolic pressure is normal.  The left ventricular ejection fraction is 50-55% by visual estimate.  A STENT XIENCE ALPINE RX 4.0X28 drug eluting stent was successfully placed, and does not overlap previously placed stent.  Prox LAD to Mid LAD lesion, 99 %stenosed.  Post intervention, there is a 0% residual stenosis.  Ost 1st Diag lesion, 90 %stenosed. STEMI  Diagnostic catheterization with 99% proximal LAD lesion mild to moderate thrombus  Left dominant system  Successful PCI and stent to the proximal LAD with DES reducing lesion from 99 down to 0%  Borderline reduced left ventricular function ejection fraction of 50%    Past Medical History:  Diagnosis Date  . GERD (gastroesophageal reflux disease)   . Kidney stones   . MI (myocardial infarction) [facility])     Patient Active Problem List   Diagnosis Date Noted  . Chest pain 02/09/2017  . STEMI (ST elevation myocardial infarction) (HCC) 09/05/2016     Past Surgical History:  Procedure Laterality Date  . ABDOMINAL SURGERY    . CORONARY/GRAFT ACUTE MI REVASCULARIZATION N/A 09/05/2016   Procedure: Coronary/Graft Acute MI Revascularization;  Surgeon: Alwyn Pea, MD;  Location: [facility] INVASIVE CV LAB;  Service: Cardiovascular;  Laterality: N/A;  prox lad  . HERNIA REPAIR    . LEFT HEART CATH AND CORONARY ANGIOGRAPHY N/A 09/05/2016   Procedure: LEFT HEART CATH AND CORONARY ANGIOGRAPHY;  Surgeon: Alwyn Pea, MD;  Location: [facility] INVASIVE CV LAB;  Service: Cardiovascular;  Laterality: N/A;  . Right knee surgery      Prior to Admission medications   Medication Sig Start Date End Date Taking? Authorizing Provider  aspirin 81 MG chewable tablet Chew 1 tablet (81 mg total) by mouth daily. 09/08/16  Yes Milagros Loll, MD  clopidogrel (PLAVIX) 75 MG tablet Take 1 tablet (75 mg total) by mouth daily with breakfast. 09/08/16  Yes Sudini, Wardell Heath, MD  albuterol (PROVENTIL HFA;VENTOLIN HFA) 108 (90 Base) MCG/ACT inhaler Inhale 2 puffs into the lungs every 6 (six) hours as needed for wheezing or shortness of breath. 10/13/16   Willy Eddy, MD  atorvastatin (LIPITOR) 80 MG tablet Take 1 tablet (80 mg total) by mouth daily at 6 PM. Patient not taking: Reported on 02/09/2017 09/07/16   Milagros Loll, MD  lisinopril (PRINIVIL,ZESTRIL) 2.5 MG tablet Take 1 tablet (2.5 mg total) by mouth 2 (two) times daily. Patient not taking: Reported on 02/09/2017 09/07/16   Milagros Loll, MD  metoprolol tartrate (LOPRESSOR) 25 MG  tablet Take 0.5 tablets (12.5 mg total) by mouth 2 (two) times daily. Patient not taking: Reported on 02/09/2017 09/07/16   Milagros LollSudini, Srikar, MD  predniSONE (DELTASONE) 10 MG tablet Take 1 tablet (10 mg total) by mouth daily. Day 1-2: Take 50 mg  ( 5 pills) Day 3-4 : Take 40 mg (4pills) Day 5-6: Take 30 mg (3 pills) Day 7-8:  Take 20 mg (2 pills) Day 9:  Take 10mg  (1 pill) Patient not taking: Reported on 02/09/2017 10/13/16   Willy Eddyobinson,  Patrick, MD  Spacer/Aero-Holding Chambers (AEROCHAMBER MV) inhaler Use as instructed 10/13/16   Willy Eddyobinson, Patrick, MD  traMADol (ULTRAM) 50 MG tablet Take 1 tablet (50 mg total) by mouth every 6 (six) hours as needed for severe pain. Patient not taking: Reported on 10/13/2016 09/07/16 09/07/17  Milagros LollSudini, Srikar, MD    Allergies Patient has no known allergies.  Family History  Problem Relation Age of Onset  . CAD Mother   . Hypertension Father   . Cerebral aneurysm Maternal Grandmother   . CAD Maternal Grandfather     Social History Social History   Tobacco Use  . Smoking status: Current Every Day Smoker  . Smokeless tobacco: Never Used  Substance Use Topics  . Alcohol use: Yes  . Drug use: No    Review of Systems Constitutional: No fever/chills Eyes: No visual changes. ENT: No sore throat. Cardiovascular: Positive for chest pain. Respiratory: Positive for shortness of breath. Gastrointestinal: No abdominal pain.  Positive for nausea, no vomiting.  No diarrhea.  No constipation. Genitourinary: Negative for dysuria. Musculoskeletal: Negative for back pain. Skin: Negative for rash. Neurological: Negative for headaches, focal weakness or numbness.   ____________________________________________   PHYSICAL EXAM:  VITAL SIGNS: ED Triage Vitals  Enc Vitals Group     BP      Pulse      Resp      Temp      Temp src      SpO2      Weight      Height      Head Circumference      Peak Flow      Pain Score      Pain Loc      Pain Edu?      Excl. in GC?     Constitutional: Alert and oriented x4 holding his chest appears uncomfortable Eyes: PERRL EOMI. Head: Atraumatic. Nose: No congestion/rhinnorhea. Mouth/Throat: No trismus Neck: No stridor.   Cardiovascular: Tachycardic rate, regular rhythm. Grossly normal heart sounds.  Good peripheral circulation. Respiratory: Normal respiratory effort.  No retractions. Lungs CTAB and moving good air Gastrointestinal: Soft  nontender Musculoskeletal: No lower extremity edema legs are equal in size Neurologic:  Normal speech and language. No gross focal neurologic deficits are appreciated. Skin: Light diaphoresis Psychiatric: Somewhat anxious appearing.    ____________________________________________   DIFFERENTIAL includes but not limited to  Acute coronary syndrome, pulmonary embolism, aortic dissection, pneumothorax ____________________________________________   LABS (all labs ordered are listed, but only abnormal results are displayed)  Labs Reviewed  COMPREHENSIVE METABOLIC PANEL - Abnormal; Notable for the following components:      Result Value   CO2 19 (*)    Glucose, Bld 156 (*)    BUN 22 (*)    Creatinine, Ser 1.35 (*)    Calcium 8.4 (*)    GFR calc non Af Amer 59 (*)    All other components within normal limits  CBC WITH DIFFERENTIAL/PLATELET - Abnormal; Notable for the  following components:   Neutro Abs 6.7 (*)    All other components within normal limits  BRAIN NATRIURETIC PEPTIDE  TROPONIN I  TSH  HEMOGLOBIN A1C  TROPONIN I  TROPONIN I  TROPONIN I  LIPID PANEL    Lab work reviewed by me with no signs of acute ischemia on first check __________________________________________  EKG  ED ECG REPORT I, Merrily Brittle, the attending physician, personally viewed and interpreted this ECG.  Date: 02/09/2017 EKG Time:  Rate: 98 Rhythm: normal sinus rhythm QRS Axis: normal Intervals: normal ST/T Wave abnormalities: Mild ST depression V3 V4 Narrative Interpretation: No frank ST elevation  ____________________________________________  RADIOLOGY  Chest x-ray reviewed by me with no acute disease ____________________________________________   PROCEDURES  Procedure(s) performed: no  Procedures  Critical Care performed: no  Observation: no ____________________________________________   INITIAL IMPRESSION / ASSESSMENT AND PLAN / ED COURSE  Pertinent labs &  imaging results that were available during my care of the patient were reviewed by me and considered in my medical decision making (see chart for details).  The patient's first EKG shows no ST elevation but does show some mild ST depression V3 V4.  His history is concerning for unstable angina.  Regardless of the results of the patient's troponin he will require inpatient admission for full cardiac evaluation and risk stratification.    Fortunately the patient's first troponin is negative however he has a concerning story with acute EKG changes.  At this point he requires inpatient admission for serial troponins and cardiac evaluation.  I discussed with the patient verbalized understanding and agreement with the plan.  I then discussed with the hospitalist Dr. Sheryle Hail who is graciously agreed to admit the patient to his service. ____________________________________________   FINAL CLINICAL IMPRESSION(S) / ED DIAGNOSES  Final diagnoses:  Chest pain, unspecified type      NEW MEDICATIONS STARTED DURING THIS VISIT:  Current Discharge Medication List       Note:  This document was prepared using Dragon voice recognition software and may include unintentional dictation errors.     Merrily Brittle, MD 02/09/17 [phone]

## 2017-02-09 NOTE — H&P (Signed)
Cody Harr. is an 52 y.o. male.   Chief Complaint: Chest pain HPI: The patient with past medical history of coronary artery disease status post MI and stent placement to the proximal LAD presents the emergency department with chest pain.  The pain awoke the patient from sleep.  He became very diaphoretic and nauseous.  The pain was under his left breast but did not radiate.  He then became very short of breath and decided to walk outside into the cold air to see if that may help.  When it did not improve he came to the emergency department for further evaluation.  Despite aspirin 324 mg, nitroglycerin spray and Ativan the patient's pain continued which prompted the emergency department staff to call the hospitalist service for admission.  Past Medical History:  Diagnosis Date  . GERD (gastroesophageal reflux disease)   . Kidney stones   . MI (myocardial infarction) [facility])     Past Surgical History:  Procedure Laterality Date  . ABDOMINAL SURGERY    . CORONARY/GRAFT ACUTE MI REVASCULARIZATION N/A 09/05/2016   Procedure: Coronary/Graft Acute MI Revascularization;  Surgeon: Yolonda Kida, MD;  Location: [place] CV LAB;  Service: Cardiovascular;  Laterality: N/A;  prox lad  . HERNIA REPAIR    . LEFT HEART CATH AND CORONARY ANGIOGRAPHY N/A 09/05/2016   Procedure: LEFT HEART CATH AND CORONARY ANGIOGRAPHY;  Surgeon: Yolonda Kida, MD;  Location: [place] CV LAB;  Service: Cardiovascular;  Laterality: N/A;  . Right knee surgery      Family History  Problem Relation Age of Onset  . CAD Mother   . Hypertension Father   . Cerebral aneurysm Maternal Grandmother   . CAD Maternal Grandfather    Social History:  reports that he has been smoking.  he has never used smokeless tobacco. He reports that he drinks alcohol. He reports that he does not use drugs.  Allergies: No Known Allergies  Prior to Admission medications   Medication Sig Start Date End Date Taking? Authorizing  Provider  aspirin 81 MG chewable tablet Chew 1 tablet (81 mg total) by mouth daily. 09/08/16  Yes Hillary Bow, MD  clopidogrel (PLAVIX) 75 MG tablet Take 1 tablet (75 mg total) by mouth daily with breakfast. 09/08/16  Yes Sudini, Alveta Heimlich, MD  albuterol (PROVENTIL HFA;VENTOLIN HFA) 108 (90 Base) MCG/ACT inhaler Inhale 2 puffs into the lungs every 6 (six) hours as needed for wheezing or shortness of breath. 10/13/16   Merlyn Lot, MD  atorvastatin (LIPITOR) 80 MG tablet Take 1 tablet (80 mg total) by mouth daily at 6 PM. Patient not taking: Reported on 02/09/2017 09/07/16   Hillary Bow, MD  lisinopril (PRINIVIL,ZESTRIL) 2.5 MG tablet Take 1 tablet (2.5 mg total) by mouth 2 (two) times daily. Patient not taking: Reported on 02/09/2017 09/07/16   Hillary Bow, MD  metoprolol tartrate (LOPRESSOR) 25 MG tablet Take 0.5 tablets (12.5 mg total) by mouth 2 (two) times daily. Patient not taking: Reported on 02/09/2017 09/07/16   Hillary Bow, MD  predniSONE (DELTASONE) 10 MG tablet Take 1 tablet (10 mg total) by mouth daily. Day 1-2: Take 50 mg  ( 5 pills) Day 3-4 : Take 40 mg (4pills) Day 5-6: Take 30 mg (3 pills) Day 7-8:  Take 20 mg (2 pills) Day 9:  Take 68m (1 pill) Patient not taking: Reported on 02/09/2017 10/13/16   RMerlyn Lot MD  Spacer/Aero-Holding Chambers (AEROCHAMBER MV) inhaler Use as instructed 10/13/16   RMerlyn Lot MD  traMADol (ULTRAM) 50 MG tablet Take 1 tablet (50 mg total) by mouth every 6 (six) hours as needed for severe pain. Patient not taking: Reported on 10/13/2016 09/07/16 09/07/17  Hillary Bow, MD     Results for orders placed or performed during the hospital encounter of 02/09/17 (from the past 48 hour(s))  Comprehensive metabolic panel     Status: Abnormal   Collection Time: 02/09/17  2:29 AM  Result Value Ref Range   Sodium 136 135 - 145 mmol/L   Potassium 4.0 3.5 - 5.1 mmol/L   Chloride 107 101 - 111 mmol/L   CO2 19 (L) 22 - 32 mmol/L   Glucose,  Bld 156 (H) 65 - 99 mg/dL   BUN 22 (H) 6 - 20 mg/dL   Creatinine, Ser 1.35 (H) 0.61 - 1.24 mg/dL   Calcium 8.4 (L) 8.9 - 10.3 mg/dL   Total Protein 6.5 6.5 - 8.1 g/dL   Albumin 3.6 3.5 - 5.0 g/dL   AST 27 15 - 41 U/L   ALT 25 17 - 63 U/L   Alkaline Phosphatase 63 38 - 126 U/L   Total Bilirubin 0.7 0.3 - 1.2 mg/dL   GFR calc non Af Amer 59 (L) >60 mL/min   GFR calc Af Amer >60 >60 mL/min    Comment: (NOTE) The eGFR has been calculated using the CKD EPI equation. This calculation has not been validated in all clinical situations. eGFR's persistently <60 mL/min signify possible Chronic Kidney Disease.    Anion gap 10 5 - 15    Comment: Performed at [facility], [address] [place], [place] [address]  Brain natriuretic peptide     Status: None   Collection Time: 02/09/17  2:29 AM  Result Value Ref Range   B Natriuretic Peptide 20.0 0.0 - 100.0 pg/mL    Comment: Performed at [facility], [address] [place], [place] [address]  Troponin I     Status: None   Collection Time: 02/09/17  2:29 AM  Result Value Ref Range   Troponin I <0.03 <0.03 ng/mL    Comment: Performed at [facility], [address] [place], [place] [address]  CBC with Differential     Status: Abnormal   Collection Time: 02/09/17  2:29 AM  Result Value Ref Range   WBC 9.3 3.8 - 10.6 K/uL   RBC 5.18 4.40 - 5.90 MIL/uL   Hemoglobin 16.1 13.0 - 18.0 g/dL   HCT 47.3 40.0 - 52.0 %   MCV 91.3 80.0 - 100.0 fL   MCH 31.1 26.0 - 34.0 pg   MCHC 34.0 32.0 - 36.0 g/dL   RDW 13.8 11.5 - 14.5 %   Platelets 190 150 - 440 K/uL   Neutrophils Relative % 72 %   Neutro Abs 6.7 (H) 1.4 - 6.5 K/uL   Lymphocytes Relative 19 %   Lymphs Abs 1.8 1.0 - 3.6 K/uL   Monocytes Relative 6 %   Monocytes Absolute 0.6 0.2 - 1.0 K/uL   Eosinophils Relative 2 %   Eosinophils Absolute 0.1 0 - 0.7 K/uL   Basophils Relative 1 %   Basophils Absolute 0.1 0 - 0.1 K/uL    Comment: Performed at [facility] [address] [place], [place] [address]   Dg Chest Port 1 View  Result Date: 02/09/2017 CLINICAL DATA:  Chest pain EXAM: PORTABLE CHEST 1 VIEW COMPARISON:  Chest radiograph 10/13/2016 FINDINGS: The heart size and mediastinal contours are within normal limits.  Both lungs are clear. The visualized skeletal structures are unremarkable. IMPRESSION: No active disease. Electronically Signed   By: Ulyses Jarred M.D.   On: 02/09/2017 02:39    Review of Systems  Constitutional: Positive for diaphoresis. Negative for chills and fever.  HENT: Negative for sore throat and tinnitus.   Eyes: Negative for blurred vision and redness.  Respiratory: Positive for cough and shortness of breath.   Cardiovascular: Positive for chest pain. Negative for palpitations, orthopnea and PND.  Gastrointestinal: Positive for nausea. Negative for abdominal pain, diarrhea and vomiting.  Genitourinary: Negative for dysuria, frequency and urgency.  Musculoskeletal: Negative for joint pain and myalgias.  Skin: Negative for rash.       No lesions  Neurological: Negative for speech change, focal weakness and weakness.  Endo/Heme/Allergies: Does not bruise/bleed easily.       No temperature intolerance  Psychiatric/Behavioral: Negative for depression and suicidal ideas.    Blood pressure (!) 119/92, pulse 77, resp. rate 15, height 6' (1.829 m), weight 117.9 kg (260 lb), SpO2 98 %. Physical Exam  Vitals reviewed. Constitutional: He is oriented to person, place, and time. He appears well-developed and well-nourished. No distress.  HENT:  Head: Normocephalic and atraumatic.  Mouth/Throat: Oropharynx is clear and moist.  Eyes: Conjunctivae and EOM are normal. Pupils are equal, round, and reactive to light. No scleral icterus.  Neck: Normal range of motion. Neck supple. No JVD present. No tracheal deviation present. No thyromegaly present.  Cardiovascular: Normal rate, regular rhythm and normal heart sounds.  Exam reveals no gallop and no friction rub.  No murmur heard. Respiratory: Effort normal and breath sounds normal. No respiratory distress.  GI: Soft. Bowel sounds are normal. He exhibits no distension. There is no tenderness.  Genitourinary:  Genitourinary Comments: Deferred  Musculoskeletal: Normal range of motion. He exhibits no edema.  Lymphadenopathy:    He has no cervical adenopathy.  Neurological: He is alert and oriented to person, place, and time. No cranial nerve deficit.  Skin: Skin is warm and dry. No rash noted. No erythema.  Psychiatric: His behavior is normal. Judgment and thought content normal. His mood appears anxious.     Assessment/Plan This is a 52 year old male admitted for chest pain. 1.  Chest pain: Ongoing.  I have applied Nitropaste to his chest.  The patient is also very anxious and I have given him another milligram of Ativan p.o. to help.  Morphine IV as needed for severe pain.  Continue to monitor telemetry.  Consult cardiology.  Follow cardiac biomarkers.  So far EKG shows no signs of ischemia and the patient's troponin is negative. 2.  CAD: Status post PCI to LAD 4 months ago.  Continue aspirin.  I have added metoprolol to the patient's regimen to decrease myocardial oxygen demand. 3.  Acute kidney injury: Hydrate with intravenous fluid.  Avoid nephrotoxic agents. 4.  Obesity: BMI 35; encouraged healthy diet and exercise 5.  Tobacco abuse: I started the patient on Wellbutrin for anxiety and smoking cessation. 6.  DVT prophylaxis: Heparin 7.  GI prophylaxis: None The patient is a full code.  Time spent on admission orders and patient care approximately 45 minutes  Harrie Foreman, MD 02/09/2017, 4:04 AM

## 2017-02-09 NOTE — Care Management (Signed)
Spoke with Cody Clay's sister [name] at the bedside. States that her brother already goes to the [facility]. Instructed to go over to the [facility] to get his prescriptions fill after discharged Discharge to home today per Dr. Sherryll BurgerShah. Gwenette GreetBrenda S Zooey Schreurs RN MSN CCM Care Management [phone]

## 2017-02-09 NOTE — Consult Note (Signed)
[facility] Cardiology Consultation Note  Patient ID: Cody Durnin., MRN: [id number], DOB/AGE: 52-13-1967 52 y.o. Admit date: 02/09/2017   Date of Consult: 02/09/2017 Primary Physician: Patient, No Pcp Per Primary Cardiologist: [place]  Chief Complaint:  Chief Complaint  Patient presents with  . Chest Pain   Reason for Consult: Chest pain  HPI: 52 y.o. male with known cardiovascular disease after an anterior myocardial infarction and ST elevation myocardial in August 2018.  The patient after that has been somewhat nervous about his issues although has had issues of noncompliance.  He had discontinued his statin therapy and his blood pressure medication management for treatment of his ST elevation infarction.  He did continue his Plavix and aspirin.  He has been taking Wellbutrin to try to stop smoking but still is smoking at this time.  The patient had significant symptoms last night he woke up in the middle the night short of breath with nausea chest discomfort and diaphoresis most consistent with anginal equivalent or unstable angina somewhat similar to his myocardial infarction.  EKG at the time of arrival to the emergency and the patient had normal troponin initially.  We are awaiting his second troponin to assess need for further treatment options.  The patient currently is comfortable with appropriate reinstatement of medication management including Plavix and aspirin as well as metoprolol.  Past Medical History:  Diagnosis Date  . GERD (gastroesophageal reflux disease)   . Kidney stones   . MI (myocardial infarction) [facility])       Surgical History:  Past Surgical History:  Procedure Laterality Date  . ABDOMINAL SURGERY    . CORONARY/GRAFT ACUTE MI REVASCULARIZATION N/A 09/05/2016   Procedure: Coronary/Graft Acute MI Revascularization;  Surgeon: Alwyn Pea, MD;  Location: [facility] INVASIVE CV LAB;  Service: Cardiovascular;  Laterality: N/A;  prox lad  . HERNIA REPAIR     . LEFT HEART CATH AND CORONARY ANGIOGRAPHY N/A 09/05/2016   Procedure: LEFT HEART CATH AND CORONARY ANGIOGRAPHY;  Surgeon: Alwyn Pea, MD;  Location: [facility] INVASIVE CV LAB;  Service: Cardiovascular;  Laterality: N/A;  . Right knee surgery       Home Meds: Prior to Admission medications   Medication Sig Start Date End Date Taking? Authorizing Provider  aspirin 81 MG chewable tablet Chew 1 tablet (81 mg total) by mouth daily. 09/08/16  Yes Milagros Loll, MD  clopidogrel (PLAVIX) 75 MG tablet Take 1 tablet (75 mg total) by mouth daily with breakfast. 09/08/16  Yes Sudini, Wardell Heath, MD  albuterol (PROVENTIL HFA;VENTOLIN HFA) 108 (90 Base) MCG/ACT inhaler Inhale 2 puffs into the lungs every 6 (six) hours as needed for wheezing or shortness of breath. 10/13/16   Willy Eddy, MD  atorvastatin (LIPITOR) 80 MG tablet Take 1 tablet (80 mg total) by mouth daily at 6 PM. Patient not taking: Reported on 02/09/2017 09/07/16   Milagros Loll, MD  lisinopril (PRINIVIL,ZESTRIL) 2.5 MG tablet Take 1 tablet (2.5 mg total) by mouth 2 (two) times daily. Patient not taking: Reported on 02/09/2017 09/07/16   Milagros Loll, MD  metoprolol tartrate (LOPRESSOR) 25 MG tablet Take 0.5 tablets (12.5 mg total) by mouth 2 (two) times daily. Patient not taking: Reported on 02/09/2017 09/07/16   Milagros Loll, MD  predniSONE (DELTASONE) 10 MG tablet Take 1 tablet (10 mg total) by mouth daily. Day 1-2: Take 50 mg  ( 5 pills) Day 3-4 : Take 40 mg (4pills) Day 5-6: Take 30 mg (3 pills) Day 7-8:  Take 20  mg (2 pills) Day 9:  Take 10mg  (1 pill) Patient not taking: Reported on 02/09/2017 10/13/16   Willy Eddyobinson, Patrick, MD  Spacer/Aero-Holding Chambers (AEROCHAMBER MV) inhaler Use as instructed 10/13/16   Willy Eddyobinson, Patrick, MD  traMADol (ULTRAM) 50 MG tablet Take 1 tablet (50 mg total) by mouth every 6 (six) hours as needed for severe pain. Patient not taking: Reported on 10/13/2016 09/07/16 09/07/17  Milagros LollSudini, Srikar, MD     Inpatient Medications:  . aspirin  81 mg Oral Daily  . buPROPion  75 mg Oral BID  . clopidogrel  75 mg Oral Q breakfast  . docusate sodium  100 mg Oral BID  . enoxaparin (LOVENOX) injection  40 mg Subcutaneous Q24H  . metoprolol tartrate  12.5 mg Oral BID   . sodium chloride 125 mL/hr at 02/09/17 16100552    Allergies: No Known Allergies  Social History   Socioeconomic History  . Marital status: Single    Spouse name: Not on file  . Number of children: Not on file  . Years of education: Not on file  . Highest education level: Not on file  Social Needs  . Financial resource strain: Not on file  . Food insecurity - worry: Not on file  . Food insecurity - inability: Not on file  . Transportation needs - medical: Not on file  . Transportation needs - non-medical: Not on file  Occupational History  . Not on file  Tobacco Use  . Smoking status: Current Every Day Smoker  . Smokeless tobacco: Never Used  Substance and Sexual Activity  . Alcohol use: Yes  . Drug use: No  . Sexual activity: Yes  Other Topics Concern  . Not on file  Social History Narrative  . Not on file     Family History  Problem Relation Age of Onset  . CAD Mother   . Hypertension Father   . Cerebral aneurysm Maternal Grandmother   . CAD Maternal Grandfather      Review of Systems Positive for nausea diaphoresis Negative for: General:  chills, fever, night sweats or weight changes.  Cardiovascular: PND orthopnea syncope dizziness  Dermatological skin lesions rashes Respiratory: Cough congestion Urologic: Frequent urination urination at night and hematuria Abdominal: negative for   vomiting, diarrhea, bright red blood per rectum, melena, or hematemesis Neurologic: negative for visual changes, and/or hearing changes  All other systems reviewed and are otherwise negative except as noted above.  Labs: Recent Labs    02/09/17 0229  TROPONINI <0.03   Lab Results  Component Value Date   WBC  9.3 02/09/2017   HGB 16.1 02/09/2017   HCT 47.3 02/09/2017   MCV 91.3 02/09/2017   PLT 190 02/09/2017    Recent Labs  Lab 02/09/17 0229  NA 136  K 4.0  CL 107  CO2 19*  BUN 22*  CREATININE 1.35*  CALCIUM 8.4*  PROT 6.5  BILITOT 0.7  ALKPHOS 63  ALT 25  AST 27  GLUCOSE 156*   Lab Results  Component Value Date   CHOL 197 09/05/2016   HDL 46 09/05/2016   LDLCALC UNABLE TO CALCULATE IF TRIGLYCERIDE OVER 400 mg/dL 96/04/540908/24/2018   TRIG [id number] (H) 09/05/2016   No results found for: DDIMER  Radiology/Studies:  Dg Chest Port 1 View  Result Date: 02/09/2017 CLINICAL DATA:  Chest pain EXAM: PORTABLE CHEST 1 VIEW COMPARISON:  Chest radiograph 10/13/2016 FINDINGS: The heart size and mediastinal contours are within normal limits. Both lungs are clear. The visualized skeletal structures  are unremarkable. IMPRESSION: No active disease. Electronically Signed   By: Deatra Robinson M.D.   On: 02/09/2017 02:39    EKG: Normal sinus rhythm  Weights: Filed Weights   02/09/17 0220 02/09/17 0427  Weight: 260 lb (117.9 kg) 288 lb (130.6 kg)     Physical Exam: Blood pressure 133/76, pulse 86, temperature 98.5 F (36.9 C), resp. rate 16, height 6' (1.829 m), weight 288 lb (130.6 kg), SpO2 94 %. Body mass index is 39.06 kg/m. General: Well developed, well nourished, in no acute distress. Head eyes ears nose throat: Normocephalic, atraumatic, sclera non-icteric, no xanthomas, nares are without discharge. No apparent thyromegaly and/or mass  Lungs: Normal respiratory effort.  no wheezes, no rales, no rhonchi.  Heart: RRR with normal S1 S2. no murmur gallop, no rub, PMI is normal size and placement, carotid upstroke normal without bruit, jugular venous pressure is normal Abdomen: Soft, non-tender, non-distended with normoactive bowel sounds. No hepatomegaly. No rebound/guarding. No obvious abdominal masses. Abdominal aorta is normal size without bruit Extremities: No edema. no cyanosis, no clubbing,  no ulcers  Peripheral : 2+ bilateral upper extremity pulses, 2+ bilateral femoral pulses, 2+ bilateral dorsal pedal pulse Neuro: Alert and oriented. No facial asymmetry. No focal deficit. Moves all extremities spontaneously. Musculoskeletal: Normal muscle tone without kyphosis Psych:  Responds to questions appropriately with a normal affect.    Assessment: 52 year old male with known previous anterior apical myocardial infarction coronary artery disease tobacco abuse essential hypertension mixed hyperlipidemia noncompliant with medication management after myocardial infarction with an acute unstable angina issue without evidence of myocardial infarction by initial troponins and full relief of chest pain  Plan: 1.  Continue serial ECG and enzymes to assess for possible myocardial infarction 2.  Reinstatement of appropriate antianginal medication management including isosorbide metoprolol 3.  Continued aspirin and Plavix for previous myocardial infarction and PCI and stent placement 4.  Continue tobacco abuse and his significant hazards 5.  Further evaluation and treatment options depending on further results as above including the possibility of cardiac catheterization.  The patient understands risk and benefits of cardiac catheterization.  This includes a possibility of death stroke heart attack infection bleeding or blood clot.  He is at low risk for conscious sedation  Signed, Lamar Blinks M.D. [facility] Cardiology 02/09/2017, 8:42 AM

## 2017-02-09 NOTE — ED Notes (Signed)
Temp in room adjusted, mouth swabs given

## 2017-02-09 NOTE — ED Triage Notes (Signed)
Patient to Rm 4 via EMS from home, 1:15 am chest pain woke him.  Aspirin 324 mg patient's own.  VS:  BP 138/99 after 1 spray ntg: HR s tach 110.

## 2017-02-09 NOTE — ED Notes (Signed)
Pt transported to room 232 

## 2017-02-09 NOTE — Discharge Instructions (Signed)
Living With Anxiety °After being diagnosed with an anxiety disorder, you may be relieved to know why you have felt or behaved a certain way. It is natural to also feel overwhelmed about the treatment ahead and what it will mean for your life. With care and support, you can manage this condition and recover from it. °How to cope with anxiety °Dealing with stress °Stress is your body’s reaction to life changes and events, both good and bad. Stress can last just a few hours or it can be ongoing. Stress can play a major role in anxiety, so it is important to learn both how to cope with stress and how to think about it differently. °Talk with your health care provider or a counselor to learn more about stress reduction. He or she may suggest some stress reduction techniques, such as: °· Music therapy. This can include creating or listening to music that you enjoy and that inspires you. °· Mindfulness-based meditation. This involves being aware of your normal breaths, rather than trying to control your breathing. It can be done while sitting or walking. °· Centering prayer. This is a kind of meditation that involves focusing on a word, phrase, or sacred image that is meaningful to you and that brings you peace. °· Deep breathing. To do this, expand your stomach and inhale slowly through your nose. Hold your breath for 3-5 seconds. Then exhale slowly, allowing your stomach muscles to relax. °· Self-talk. This is a skill where you identify thought patterns that lead to anxiety reactions and correct those thoughts. °· Muscle relaxation. This involves tensing muscles then relaxing them. ° °Choose a stress reduction technique that fits your lifestyle and personality. Stress reduction techniques take time and practice. Set aside 5-15 minutes a day to do them. Therapists can offer training in these techniques. The training may be covered by some insurance plans. Other things you can do to manage stress include: °· Keeping a  stress diary. This can help you learn what triggers your stress and ways to control your response. °· Thinking about how you respond to certain situations. You may not be able to control everything, but you can control your reaction. °· Making time for activities that help you relax, and not feeling guilty about spending your time in this way. ° °Therapy combined with coping and stress-reduction skills provides the best chance for successful treatment. °Medicines °Medicines can help ease symptoms. Medicines for anxiety include: °· Anti-anxiety drugs. °· Antidepressants. °· Beta-blockers. ° °Medicines may be used as the main treatment for anxiety disorder, along with therapy, or if other treatments are not working. Medicines should be prescribed by a health care provider. °Relationships °Relationships can play a big part in helping you recover. Try to spend more time connecting with trusted friends and family members. Consider going to couples counseling, taking family education classes, or going to family therapy. Therapy can help you and others better understand the condition. °How to recognize changes in your condition °Everyone has a different response to treatment for anxiety. Recovery from anxiety happens when symptoms decrease and stop interfering with your daily activities at home or work. This may mean that you will start to: °· Have better concentration and focus. °· Sleep better. °· Be less irritable. °· Have more energy. °· Have improved memory. ° °It is important to recognize when your condition is getting worse. Contact your health care provider if your symptoms interfere with home or work and you do not feel like your condition   is improving. Where to find help and support: You can get help and support from these sources:  Self-help groups.  Online and [employer].  A trusted spiritual leader.  Couples counseling.  Family education classes.  Family therapy.  Follow these  instructions at home:  Eat a healthy diet that includes plenty of vegetables, fruits, whole grains, low-fat dairy products, and lean protein. Do not eat a lot of foods that are high in solid fats, added sugars, or salt.  Exercise. Most adults should do the following: ? Exercise for at least 150 minutes each week. The exercise should increase your heart rate and make you sweat (moderate-intensity exercise). ? Strengthening exercises at least twice a week.  Cut down on caffeine, tobacco, alcohol, and other potentially harmful substances.  Get the right amount and quality of sleep. Most adults need 7-9 hours of sleep each night.  Make choices that simplify your life.  Take over-the-counter and prescription medicines only as told by your health care provider.  Avoid caffeine, alcohol, and certain over-the-counter cold medicines. These may make you feel worse. Ask your pharmacist which medicines to avoid.  Keep all follow-up visits as told by your health care provider. This is important. Questions to ask your health care provider  Would I benefit from therapy?  How often should I follow up with a health care provider?  How long do I need to take medicine?  Are there any long-term side effects of my medicine?  Are there any alternatives to taking medicine? Contact a health care provider if:  You have a hard time staying focused or finishing daily tasks.  You spend many hours a day feeling worried about everyday life.  You become exhausted by worry.  You start to have headaches, feel tense, or have nausea.  You urinate more than normal.  You have diarrhea. Get help right away if:  You have a racing heart and shortness of breath.  You have thoughts of hurting yourself or others. If you ever feel like you may hurt yourself or others, or have thoughts about taking your own life, get help right away. You can go to your nearest emergency department or call:  Your local emergency  services (911 in the [place]  A suicide crisis helpline, such as the National Suicide Prevention Lifeline at 252 285 0024. This is open 24-hours a day.  Summary  Taking steps to deal with stress can help calm you.  Medicines cannot cure anxiety disorders, but they can help ease symptoms.  Family, friends, and partners can play a big part in helping you recover from an anxiety disorder. This information is not intended to replace advice given to you by your health care provider. Make sure you discuss any questions you have with your health care provider. Document Released: 12/25/2015 Document Revised: 12/25/2015 Document Reviewed: 12/25/2015 Elsevier Interactive Patient Education  2018 Elsevier Inc.   Nonspecific Chest Pain Chest pain can be caused by many different conditions. There is a chance that your pain could be related to something serious, such as a heart attack or a blood clot in your lungs. Chest pain can also be caused by conditions that are not life-threatening. If you have chest pain, it is very important to follow up with your doctor. Follow these instructions at home: Medicines  If you were prescribed an antibiotic medicine, take it as told by your doctor. Do not stop taking the antibiotic even if you start to feel better.  Take over-the-counter and prescription medicines  only as told by your doctor. Lifestyle  Do not use any products that contain nicotine or tobacco, such as cigarettes and e-cigarettes. If you need help quitting, ask your doctor.  Do not drink alcohol.  Make lifestyle changes as told by your doctor. These may include: ? Getting regular exercise. Ask your doctor for some activities that are safe for you. ? Eating a heart-healthy diet. A diet specialist (dietitian) can help you to learn healthy eating options. ? Staying at a healthy weight. ? Managing diabetes, if needed. ? Lowering your stress, as with deep breathing or spending time in  nature. General instructions  Avoid any activities that make you feel chest pain.  If your chest pain is because of heartburn: ? Raise (elevate) the head of your bed about 6 inches (15 cm). You can do this by putting blocks under the bed legs at the head of the bed. ? Do not sleep with extra pillows under your head. That does not help heartburn.  Keep all follow-up visits as told by your doctor. This is important. This includes any further testing if your chest pain does not go away. Contact a doctor if:  Your chest pain does not go away.  You have a rash with blisters on your chest.  You have a fever.  You have chills. Get help right away if:  Your chest pain is worse.  You have a cough that gets worse, or you cough up blood.  You have very bad (severe) pain in your belly (abdomen).  You are very weak.  You pass out (faint).  You have either of these for no clear reason: ? Sudden chest discomfort. ? Sudden discomfort in your arms, back, neck, or jaw.  You have shortness of breath at any time.  You suddenly start to sweat, or your skin gets clammy.  You feel sick to your stomach (nauseous).  You throw up (vomit).  You suddenly feel light-headed or dizzy.  Your heart starts to beat fast, or it feels like it is skipping beats. These symptoms may be an emergency. Do not wait to see if the symptoms will go away. Get medical help right away. Call your local emergency services (911 in the [place] Do not drive yourself to the hospital. This information is not intended to replace advice given to you by your health care provider. Make sure you discuss any questions you have with your health care provider. Document Released: 06/18/2007 Document Revised: 09/24/2015 Document Reviewed: 09/24/2015 Elsevier Interactive Patient Education  2017 [employer]

## 2017-02-13 NOTE — Discharge Summary (Signed)
[facility] [place] [facility]   PATIENT NAME: Cody Clay    MR#:  [id number]  DATE OF BIRTH:  Jul 31, 1965  DATE OF ADMISSION:  02/09/2017   ADMITTING PHYSICIAN: Arnaldo Natal, MD  DATE OF DISCHARGE: 02/09/2017  2:59 PM  PRIMARY CARE PHYSICIAN: Patient, No Pcp Per   ADMISSION DIAGNOSIS:  Chest pain, unspecified type [R07.9] DISCHARGE DIAGNOSIS:  Active Problems:   Chest pain  SECONDARY DIAGNOSIS:   Past Medical History:  Diagnosis Date  . GERD (gastroesophageal reflux disease)   . Kidney stones   . MI (myocardial infarction) [facility])    HOSPITAL COURSE:  52 y.o. male with known cardiovascular disease after an anterior myocardial infarction and ST elevation myocardial in August 2018.  The patient after that has been somewhat nervous about his issues although has had issues of noncompliance.  He had discontinued his statin therapy and his blood pressure medication management for treatment of his ST elevation infarction.  He did continue his Plavix and aspirin.  He has been taking Wellbutrin to try to stop smoking but still is smoking at this time.  The patient had significant symptoms last night he woke up in the middle the night short of breath with nausea chest discomfort and diaphoresis most consistent with anginal equivalent or unstable angina somewhat similar to his myocardial infarction.  EKG at the time of arrival to the emergency and the patient had normal troponin initially.  his serial troponin remain neg  The patient currently is comfortable with appropriate reinstatement of medication management including Plavix and aspirin as well as metoprolol  1.  Chest pain: Likely due to anxiety, recommend outpt psych eval  2.  CAD: Status post PCI to LAD 4 months ago.  Continue aspirin. added metoprolol to the patient's regimen to decrease myocardial oxygen demand. 3.  Acute kidney injury: Hydrated with intravenous fluid.  improved 4.  Obesity: BMI 35;  encouraged healthy diet and exercise DISCHARGE CONDITIONS:  stable CONSULTS OBTAINED:  Treatment Team:  Lamar Blinks, MD DRUG ALLERGIES:  No Known Allergies DISCHARGE MEDICATIONS:   Allergies as of 02/09/2017   No Known Allergies     Medication List    STOP taking these medications   predniSONE 10 MG tablet Commonly known as:  DELTASONE     TAKE these medications   AEROCHAMBER MV inhaler Use as instructed   albuterol 108 (90 Base) MCG/ACT inhaler Commonly known as:  PROVENTIL HFA;VENTOLIN HFA Inhale 2 puffs into the lungs every 6 (six) hours as needed for wheezing or shortness of breath.   ALPRAZolam 1 MG tablet Commonly known as:  XANAX Take 1 tablet (1 mg total) by mouth at bedtime as needed for up to 5 days for sleep.   aspirin 81 MG chewable tablet Chew 1 tablet (81 mg total) by mouth daily.   atorvastatin 80 MG tablet Commonly known as:  LIPITOR Take 1 tablet (80 mg total) by mouth daily at 6 PM.   buPROPion 75 MG tablet Commonly known as:  WELLBUTRIN Take 1 tablet (75 mg total) by mouth 2 (two) times daily.   clopidogrel 75 MG tablet Commonly known as:  PLAVIX Take 1 tablet (75 mg total) by mouth daily with breakfast.   lisinopril 2.5 MG tablet Commonly known as:  PRINIVIL,ZESTRIL Take 1 tablet (2.5 mg total) by mouth 2 (two) times daily.   metoprolol tartrate 25 MG tablet Commonly known as:  LOPRESSOR Take 0.5 tablets (12.5 mg total) by mouth 2 (  two) times daily.   traMADol 50 MG tablet Commonly known as:  ULTRAM Take 1 tablet (50 mg total) by mouth every 6 (six) hours as needed for severe pain.        DISCHARGE INSTRUCTIONS:   DIET:  Regular diet DISCHARGE CONDITION:  Good ACTIVITY:  Activity as tolerated OXYGEN:  Home Oxygen: No.  Oxygen Delivery: room air DISCHARGE LOCATION:  home   If you experience worsening of your admission symptoms, develop shortness of breath, life threatening emergency, suicidal or homicidal thoughts  you must seek medical attention immediately by calling 911 or calling your MD immediately  if symptoms less severe.  You Must read complete instructions/literature along with all the possible adverse reactions/side effects for all the Medicines you take and that have been prescribed to you. Take any new Medicines after you have completely understood and accpet all the possible adverse reactions/side effects.   Please note  You were cared for by a hospitalist during your hospital stay. If you have any questions about your discharge medications or the care you received while you were in the hospital after you are discharged, you can call the unit and asked to speak with the hospitalist on call if the hospitalist that took care of you is not available. Once you are discharged, your primary care physician will handle any further medical issues. Please note that NO REFILLS for any discharge medications will be authorized once you are discharged, as it is imperative that you return to your primary care physician (or establish a relationship with a primary care physician if you do not have one) for your aftercare needs so that they can reassess your need for medications and monitor your lab values.    On the day of Discharge:  VITAL SIGNS:  Blood pressure 133/76, pulse 86, temperature 98.5 F (36.9 C), resp. rate 16, height 6' (1.829 m), weight 130.6 kg (288 lb), SpO2 94 %. PHYSICAL EXAMINATION:  GENERAL:  52 y.o.-year-old patient lying in the bed with no acute distress.  EYES: Pupils equal, round, reactive to light and accommodation. No scleral icterus. Extraocular muscles intact.  HEENT: Head atraumatic, normocephalic. Oropharynx and nasopharynx clear.  NECK:  Supple, no jugular venous distention. No thyroid enlargement, no tenderness.  LUNGS: Normal breath sounds bilaterally, no wheezing, rales,rhonchi or crepitation. No use of accessory muscles of respiration.  CARDIOVASCULAR: S1, S2 normal. No  murmurs, rubs, or gallops.  ABDOMEN: Soft, non-tender, non-distended. Bowel sounds present. No organomegaly or mass.  EXTREMITIES: No pedal edema, cyanosis, or clubbing.  NEUROLOGIC: Cranial nerves II through XII are intact. Muscle strength 5/5 in all extremities. Sensation intact. Gait not checked.  PSYCHIATRIC: The patient is alert and oriented x 3.  SKIN: No obvious rash, lesion, or ulcer.  DATA REVIEW:   CBC Recent Labs  Lab 02/09/17 0229  WBC 9.3  HGB 16.1  HCT 47.3  PLT 190    Chemistries  Recent Labs  Lab 02/09/17 0229  NA 136  K 4.0  CL 107  CO2 19*  GLUCOSE 156*  BUN 22*  CREATININE 1.35*  CALCIUM 8.4*  AST 27  ALT 25  ALKPHOS 63  BILITOT 0.7     Follow-up Information    [facility]. Go on 02/19/2017.   Specialty:  Primary Care Why:  as scheduled Contact information: [address] [place] [id number] [phone]       Alwyn Peaallwood, Dwayne D, MD. Go on 02/12/2017.   Specialties:  Cardiology, Internal Medicine Why:  @11 :30AM..... $100 DEPOSIT FOR PTS WITH NO INSURANCE. WILL BE APPLIED TO BILL. Contact information: [address] [facility] - CARDIOLOGY [place] [address] [phone]           Management plans discussed with the patient, family and they are in agreement.  CODE STATUS: Prior   TOTAL TIME TAKING CARE OF THIS PATIENT: 45 minutes.    Delfino Lovett M.D on 02/13/2017 at 9:39 AM  Between 7am to 6pm - Pager - [phone]  After 6pm go to [email or web address] - Social research officer, government  [facility]  Office  [phone]  CC: Primary care physician; Patient, No Pcp Per   Note: This dictation was prepared with Dragon dictation along with smaller phrase technology. Any transcriptional errors that result from this process are unintentional.

## 2017-03-20 ENCOUNTER — Telehealth: Payer: Self-pay | Admitting: Pharmacy Technician

## 2017-03-20 NOTE — Telephone Encounter (Signed)
Patient failed to provide 2019 financial documentation.  No additional medication assistance will be provided by [facility] without the required proof of income documentation.  Patient notified by letter.  Betty J. Kluttz Care Manager [facility] 

## 2017-05-07 ENCOUNTER — Telehealth: Payer: Self-pay | Admitting: Pharmacy Technician

## 2017-05-07 NOTE — Telephone Encounter (Signed)
Received updated proof of income.  Patient eligible to receive medication assistance at [facility] through 2019, as long as eligibility requirements continue to be met.  [facility]

## 2017-05-25 ENCOUNTER — Encounter: Payer: Self-pay | Admitting: Pharmacist

## 2017-05-25 ENCOUNTER — Ambulatory Visit: Payer: Self-pay | Admitting: Pharmacist

## 2017-05-25 ENCOUNTER — Other Ambulatory Visit: Payer: Self-pay

## 2017-05-25 VITALS — BP 130/76 | Wt 287.0 lb

## 2017-05-25 DIAGNOSIS — Z79899 Other long term (current) drug therapy: Secondary | ICD-10-CM

## 2017-05-25 NOTE — Progress Notes (Addendum)
[facility] Visit Note  Patient: Cody Clay. MRN: [id number] Date of Birth: [birth year] PCP: Patient, No Pcp Per   Cody Clay. 52 y.o. male presents for a MTM visit today. He brought his medication bottles to the visit, however, the patient had to call his sister during the visit to answer questions about his medical history. He does not have a primary care provider at this time.  BP 130/78 and weight is 278 lbs.   Patient Information   Past Medical History:  Diagnosis Date  . GERD (gastroesophageal reflux disease)   . Kidney stones   . MI (myocardial infarction) [facility])       Past Surgical History:  Procedure Laterality Date  . ABDOMINAL SURGERY    . CORONARY/GRAFT ACUTE MI REVASCULARIZATION N/A 09/05/2016   Procedure: Coronary/Graft Acute MI Revascularization;  Surgeon: Cody Kida, MD;  Location: [facility];  Service: Cardiovascular;  Laterality: N/A;  prox lad  . HERNIA REPAIR    . LEFT HEART CATH AND CORONARY ANGIOGRAPHY N/A 09/05/2016   Procedure: LEFT HEART CATH AND CORONARY ANGIOGRAPHY;  Surgeon: Cody Kida, MD;  Location: [facility];  Service: Cardiovascular;  Laterality: N/A;  . Right knee surgery       Family History  Problem Relation Age of Onset  . CAD Mother   . Hypertension Father   . Cerebral aneurysm Maternal Grandmother   . CAD Maternal Grandfather        Family Support: His sister is in charge of all his medications and care. If any questions, contact sister.   Lifestyle:  Diet: Patient reports eating only 1 meal per day. This meal is usually dinner which consists of grilled chicken or pork chops with potatoes and mac and cheese. Or he has a grilled chicken salad. Doesn't eat anything fried, only cooks using the grill. Does consume water all day every day.    Current Exercise Habits: Home exercise routine, Type of exercise: walking, Time (Minutes): 20, Frequency (Times/Week): 3, Weekly  Exercise (Minutes/Week): 60, Intensity: Mild  Exercise limited by: None identified   Social History   Substance and Sexual Activity  Alcohol Use Yes  . Alcohol/week: 0.6 oz  . Types: 1 Cans of beer per week   Comment: 1 beer per day      Social History   Tobacco Use  Smoking Status Current Every Day Smoker  . Packs/day: 0.50  . Types: Cigarettes  Smokeless Tobacco Never Used  Tobacco Comment   Will contact PCP about getting a Chantix Rx   Outpatient Encounter Medications as of 05/25/2017  Medication Sig  . albuterol (PROVENTIL HFA;VENTOLIN HFA) 108 (90 Base) MCG/ACT inhaler Inhale 2 puffs into the lungs every 6 (six) hours as needed for wheezing or shortness of breath.  Marland Kitchen aspirin 81 MG chewable tablet Chew 1 tablet (81 mg total) by mouth daily.  Marland Kitchen atorvastatin (LIPITOR) 80 MG tablet Take 1 tablet (80 mg total) by mouth daily at 6 PM.  . citalopram (CELEXA) 10 MG tablet Take 10 mg by mouth daily.  . citalopram (CELEXA) 20 MG tablet Take 20 mg by mouth daily. Takes 30m total daily  . clopidogrel (PLAVIX) 75 MG tablet Take 1 tablet (75 mg total) by mouth daily with breakfast.  . lisinopril (PRINIVIL,ZESTRIL) 2.5 MG tablet Take 1 tablet (2.5 mg total) by mouth 2 (two) times daily.  . metoprolol tartrate (LOPRESSOR) 25 MG tablet Take 0.5 tablets (12.5 mg total) by mouth  2 (two) times daily.  Marland Kitchen buPROPion (WELLBUTRIN) 75 MG tablet Take 1 tablet (75 mg total) by mouth 2 (two) times daily. (Patient not taking: Reported on 05/25/2017)  . Spacer/Aero-Holding Chambers (AEROCHAMBER MV) inhaler Use as instructed  . [DISCONTINUED] traMADol (ULTRAM) 50 MG tablet Take 1 tablet (50 mg total) by mouth every 6 (six) hours as needed for severe pain. (Patient not taking: Reported on 10/13/2016)   No facility-administered encounter medications on file as of 05/25/2017.       Health Maintenance  Topic Date Due  . HIV Screening  10/30/1980  . TETANUS/TDAP  10/30/1984  . COLONOSCOPY  10/31/2015  .  INFLUENZA VACCINE  08/13/2017     Assessment: Compliance/Adherence: Patient states he takes all of his medications once daily at the same time of day. His sister assists with medication refills.  Anxiety: Patient had a recent heart attack and was scared about having another one. The doctor prescribed a new medication Citalopram 85m; recently increased to 321mdaily. Patient reports this dosage has helped him a lot and feels much better.   Smoking Cessation: Patient is ready to quit. Used to smoke 2ppd but now smokes 0.5 ppd. The MD did mention Chantix to patient but the patient never received it. Will send a note to MD for Rx.  Possible gout: Patient does have a history of gout although he doesn't take anything chronically. Patient reports having 1 attack per year but now with new meds reports having gout attacks twice per week. Describes having swelling and pain similar to a gout attack in his big toe and ankles. It swells and is very painful to where he has a hard time walking and standing up. Patient reports taking Tylenol with no relief. Will send a note to his MD about possible gout medication for chronic use.   BP and s/p MI: PCI/stent 09/05/16. Patient is on Clopidogrel 7560mLisinopril 2.5mg11metoprolol 12.5mg,12morvastatin 80mg 18mASA 81mg. 75mough he didn't take his medication this morning, his BP was 130/78 mmHg. Patient reports adherence to medication and reports no issues other than the leg pain.  PLAN: Pharmacist to contact Dr. CallwooClayborn Clay albuterol inhaler, smoking cessation and possible gout prophylaxis prescription. Patient will follow up in 6 months for MTM   Cosigned: Keri K. HarrisoDicky Clay [facility] Coordinator [phone]

## 2018-02-02 ENCOUNTER — Ambulatory Visit: Payer: Self-pay | Admitting: Pharmacist

## 2018-02-02 ENCOUNTER — Other Ambulatory Visit: Payer: Self-pay

## 2018-02-02 VITALS — BP 110/80 | Ht 72.0 in | Wt 314.0 lb

## 2018-02-02 DIAGNOSIS — Z79899 Other long term (current) drug therapy: Secondary | ICD-10-CM

## 2018-02-02 NOTE — Progress Notes (Signed)
[facility] Visit Note  Patient: Cody Clay. MRN: [id number] Date of Birth: July 12, 1965 PCP: Patient, No Pcp Per   Cody Clay. 53 y.o. male presents for a 45-month follow up visit with the pharmacist. He brought all of his medication bottles to the visit, however, the patient had to call his sister to confirm dosing and directions. He currently does not have a primary care provider.  BP 110/80 (BP Location: Right Arm, Patient Position: Sitting)   Ht 6' (1.829 m)   Wt (!) 314 lb (142.4 kg)   BMI 42.59 kg/m   Patient Information   Past Medical History:  Diagnosis Date  . GERD (gastroesophageal reflux disease)   . Kidney stones   . MI (myocardial infarction) [facility])       Past Surgical History:  Procedure Laterality Date  . ABDOMINAL SURGERY    . CORONARY/GRAFT ACUTE MI REVASCULARIZATION N/A 09/05/2016   Procedure: Coronary/Graft Acute MI Revascularization;  Surgeon: Alwyn Pea, MD;  Location: [facility] INVASIVE CV LAB;  Service: Cardiovascular;  Laterality: N/A;  prox lad  . HERNIA REPAIR    . LEFT HEART CATH AND CORONARY ANGIOGRAPHY N/A 09/05/2016   Procedure: LEFT HEART CATH AND CORONARY ANGIOGRAPHY;  Surgeon: Alwyn Pea, MD;  Location: [facility] INVASIVE CV LAB;  Service: Cardiovascular;  Laterality: N/A;  . Right knee surgery       Family History  Problem Relation Age of Onset  . CAD Mother   . Hypertension Father   . Cerebral aneurysm Maternal Grandmother   . CAD Maternal Grandfather     Outpatient Encounter Medications as of 02/02/2018  Medication Sig  . aspirin 81 MG chewable tablet Chew 1 tablet (81 mg total) by mouth daily.  . citalopram (CELEXA) 20 MG tablet Take 20 mg by mouth daily.   . clopidogrel (PLAVIX) 75 MG tablet Take 1 tablet (75 mg total) by mouth daily with breakfast.  . lisinopril (PRINIVIL,ZESTRIL) 5 MG tablet Take 2.5 mg by mouth daily.  . metoprolol tartrate (LOPRESSOR) 25 MG tablet Take 25 mg by mouth daily.  Prescribed twice daily- taking one daily due to side effects  . pantoprazole (PROTONIX) 40 MG tablet Take 40 mg by mouth daily.  . pravastatin (PRAVACHOL) 20 MG tablet Take 10 mg by mouth daily.  . promethazine (PHENERGAN) 25 MG tablet Take 25 mg by mouth every 6 (six) hours as needed for nausea or vomiting (nausea).  . [DISCONTINUED] metoprolol tartrate (LOPRESSOR) 25 MG tablet Take 0.5 tablets (12.5 mg total) by mouth 2 (two) times daily. (Patient taking differently: Take 25 mg by mouth daily. Prescribed twice daily, only taking one daily due to side effects)  . [DISCONTINUED] albuterol (PROVENTIL HFA;VENTOLIN HFA) 108 (90 Base) MCG/ACT inhaler Inhale 2 puffs into the lungs every 6 (six) hours as needed for wheezing or shortness of breath.  . [DISCONTINUED] atorvastatin (LIPITOR) 80 MG tablet Take 1 tablet (80 mg total) by mouth daily at 6 PM.  . [DISCONTINUED] buPROPion (WELLBUTRIN) 75 MG tablet Take 1 tablet (75 mg total) by mouth 2 (two) times daily. (Patient not taking: Reported on 05/25/2017)  . [DISCONTINUED] citalopram (CELEXA) 10 MG tablet Take 10 mg by mouth daily.  . [DISCONTINUED] lisinopril (PRINIVIL,ZESTRIL) 2.5 MG tablet Take 1 tablet (2.5 mg total) by mouth 2 (two) times daily.  . [DISCONTINUED] Spacer/Aero-Holding Chambers (AEROCHAMBER MV) inhaler Use as instructed   No facility-administered encounter medications on file as of 02/02/2018.    New Diagnoses (since last visit):  Family Support: Good    Current Exercise Habits: The patient does not participate in regular exercise at present  Exercise limited by: None identified    Social History   Substance and Sexual Activity  Alcohol Use Yes  . Alcohol/week: 2.0 standard drinks  . Types: 2 Cans of beer per week   Comment: 2 beer per day      Social History   Tobacco Use  Smoking Status Current Every Day Smoker  . Packs/day: 0.50  . Types: Cigarettes  Smokeless Tobacco Never Used  Tobacco Comment   Will contact  PCP about getting a Chantix Rx      Health Maintenance  Topic Date Due  . HIV Screening  10/30/1980  . TETANUS/TDAP  10/30/1984  . COLONOSCOPY  10/31/2015  . INFLUENZA VACCINE  08/13/2017     ASSESSMENT:  Compliance/Adherence:  Patient states he takes all of his medications once daily at 10 am. His sister assists with filling his pill box and medication refills.  Anxiety:  Patient has been taking Citalopram 20 mg daily. Previously on 30mg  daily. Patient reports this dosage has helped him to not worry so much about having a second heart attack.   Smoking Cessation:  Patient is ready to quit. Currently smokes 0.5 ppd. Patient willing to try Chantix.  Possible gout:  Patient has not had any recent attacks in over 3 months. Patient does have a history of gout although he doesn't take anything chronically. 6 months ago, patient reported "attacks" twice weekly.   BP and s/p MI:  PCI/stent 09/05/16. Patient is on Clopidogrel 75mg  daily, Lisinopril 2.5 mg daily, Metoprolol 25 mg once daily, Pravastatin 10mg  daily and ASA 81mg - Chewable daily. Patient admitted to swallowing the Aspirin whole without chewing, he was not able to identify which one of his pills was chewable. He is now able to identify this pill and chew. Also taking metoprolol once daily due to stomach upset, per his sister. Atorvastatin changed to pravastatin due to back and leg pain. BP was 110/80 mmHg.    GERD: Taking pantoprazole 40 mg daily and promethazine as needed. States he typically takes one promethazine before bed each night, otherwise GERD symptoms are controlled.  COPD:  Patient asked about his albuterol inhaler. States he previously used an inhaler but has not been able to continue the prescription and has never received through patient assistance. Pharmacist to inquire.   PLAN:  Pharmacist to contact Dr. Juliann Paresallwood about Albuterol inhaler, Chantix and enteric coated aspirin. Follow up with Dr. Juliann Paresallwood  scheduled for 03/2018 RTC 6 months   Cody Clay K. Cody Clay, BS, PharmD [facility]-Pharmacy Operations Coordinator [phone]

## 2018-03-18 ENCOUNTER — Telehealth: Payer: Self-pay | Admitting: Pharmacy Technician

## 2018-03-18 NOTE — Telephone Encounter (Signed)
Received 2020 proof of income.  Patient eligible to receive medication assistance at [facility] as long as eligibility requirements continue to be met.  [place] [facility]

## 2018-06-10 ENCOUNTER — Other Ambulatory Visit: Admission: RE | Admit: 2018-06-10 | Payer: Self-pay | Source: Ambulatory Visit

## 2018-06-11 ENCOUNTER — Other Ambulatory Visit: Admission: RE | Admit: 2018-06-11 | Payer: Self-pay | Source: Ambulatory Visit

## 2018-06-16 ENCOUNTER — Ambulatory Visit: Admit: 2018-06-16 | Payer: Self-pay | Admitting: Internal Medicine

## 2018-06-16 SURGERY — COLONOSCOPY WITH PROPOFOL
Anesthesia: General

## 2018-08-02 ENCOUNTER — Other Ambulatory Visit: Payer: Self-pay | Admitting: Pharmacist

## 2018-08-03 ENCOUNTER — Telehealth: Payer: Self-pay

## 2018-08-03 NOTE — Telephone Encounter (Signed)
Spoke with pt's sister on the phone regarding medication therapy management visit originally scheduled for 08/02/2018. Agreed to reschedule to 01/2019 for 3-way phone call with pt, pt's sister, and pharmacy.   Pt's sister is primary caregiver of pt and helps organize and maintain medication compliance for patient. Pt's sister is at work during [facility] clinic hours and pt is unable to complete MTM alone. Because pt had had two MTMs in the past year (05/2017 & 01/2018), spoke with pt's sister about rescheduling for a later date such as Jan 2021. Pt's sister reported that pt has had no issues with current medications and agrees that pushing MTM date out would be beneficial. Pt agreed to attempting 3-way phone call with pt and pt's sister in Jan 2021.   Ladoris Gene, PharmD Candidate [place] [facility]

## 2019-02-07 ENCOUNTER — Other Ambulatory Visit: Payer: Self-pay

## 2019-02-07 ENCOUNTER — Ambulatory Visit: Payer: Self-pay

## 2019-02-07 DIAGNOSIS — Z79899 Other long term (current) drug therapy: Secondary | ICD-10-CM

## 2019-02-07 NOTE — Progress Notes (Signed)
[facility] Visit Note  Patient: Cody Clay. MRN: [id number] Date of Birth: Aug 30, 1965 PCP: Patient, No Pcp Per   Dwyane Luo. 54 y.o. male presents for a telephone medication therapy management visit with the pharmacist today. Patient identified using two patient identifiers.   There were no vitals taken for this visit.  Patient Information   Past Medical History:  Diagnosis Date  . GERD (gastroesophageal reflux disease)   . Kidney stones   . MI (myocardial infarction) [facility])       Past Surgical History:  Procedure Laterality Date  . ABDOMINAL SURGERY    . CORONARY/GRAFT ACUTE MI REVASCULARIZATION N/A 09/05/2016   Procedure: Coronary/Graft Acute MI Revascularization;  Surgeon: Yolonda Kida, MD;  Location: [place] CV LAB;  Service: Cardiovascular;  Laterality: N/A;  prox lad  . HERNIA REPAIR    . LEFT HEART CATH AND CORONARY ANGIOGRAPHY N/A 09/05/2016   Procedure: LEFT HEART CATH AND CORONARY ANGIOGRAPHY;  Surgeon: Yolonda Kida, MD;  Location: [place] CV LAB;  Service: Cardiovascular;  Laterality: N/A;  . Right knee surgery       Family History  Problem Relation Age of Onset  . CAD Mother   . Hypertension Father   . Cerebral aneurysm Maternal Grandmother   . CAD Maternal Grandfather     New Diagnoses (since last visit): No  Family Support: Good. Sister is actively involved in care.     Social History   Substance and Sexual Activity  Alcohol Use Yes  . Alcohol/week: 2.0 standard drinks  . Types: 2 Cans of beer per week   Comment: 2 beer per day   Consumes two 40 oz beers per night   Social History   Tobacco Use  Smoking Status Current Every Day Smoker  . Packs/day: 0.50  . Types: Cigarettes  Smokeless Tobacco Never Used  Tobacco Comment   Will contact PCP about getting a Chantix Rx   A little more than 1/2 PPD.    Health Maintenance  Topic Date Due  . HIV Screening  10/30/1980  . TETANUS/TDAP   10/30/1984  . COLONOSCOPY  10/31/2015  . INFLUENZA VACCINE  08/14/2018   Outpatient Encounter Medications as of 02/07/2019  Medication Sig  . aspirin 81 MG chewable tablet Chew 1 tablet (81 mg total) by mouth daily.  . citalopram (CELEXA) 20 MG tablet Take 20 mg by mouth daily.   . clopidogrel (PLAVIX) 75 MG tablet Take 1 tablet (75 mg total) by mouth daily with breakfast.  . lisinopril (PRINIVIL,ZESTRIL) 5 MG tablet Take 2.5 mg by mouth daily.  . metoprolol tartrate (LOPRESSOR) 25 MG tablet Take 25 mg by mouth daily. Prescribed twice daily- taking one daily due to side effects  . pantoprazole (PROTONIX) 40 MG tablet Take 40 mg by mouth daily.  . pravastatin (PRAVACHOL) 20 MG tablet Take 10 mg by mouth daily.  . promethazine (PHENERGAN) 25 MG tablet Take 25 mg by mouth every 6 (six) hours as needed for nausea or vomiting (nausea).   No facility-administered encounter medications on file as of 02/07/2019.    Health Maintenance/Date Completed  Last ED visit: Denies recent ED visit Last Visit to PCP: Patient without a PCP at this time Next Visit to PCP: Patient without a PCP at this time Specialist Visit: Cardiology (Dr. Clayborn Bigness)- reports he is seeing him on an annual basis Dental Exam: Reports >10 years since last Eye Exam: Reading glasses. Does not have an eye doctor.  Prostate  Exam: Reports last was 8-9 years ago. No family history of prostate cancer. Colonoscopy: 12/13/1999 Flu Vaccine: No Pneumonia Vaccine: Never COVID-19 Vaccine: Never Shingrix Vaccine: Never  Assessment and Plan:  1. CAD -History of STEMI s/p stenting on DAPT with ASA 81 mg + clopidogrel 75 mg daily. Denies signs/symptoms of bleeding  -On lisinopril 2.5 mg daily and metoprolol tartrate 25 mg daily  -Metoprolol is prescribed BID but patient is only taking once daily due to stomach upset- may be good candidate for extended release formulation -History of myalgias with atorvastatin now on pravastatin 10 mg  daily. Last lipid panel 02/09/2017 with LDL 98, HDL 55, TG 166  2. Anxiety -Citalopram 20 mg daily  3. Smoking cessation -Interested in quitting. Currently smoking a little more than 1/2 PPD. -Provided information regarding [place] Quit line -He was previously informed about pharmacologic therapy and is interested in trial of Chantix  4. GERD -Pantoprazole 40 mg daily and promethazine PRN  5. Health Maintenance -Has not established care with a PCP -Due for annual influenza vaccine, PPSV23 (smoker), Shingrix (>=50 y/o), COVID-19 vaccine -Due for prostate cancer screening and colonoscopy  6. Medication adherence -Sister prepares medication for patient using a pillbox -Endorses taking medications daily as prescribed -Requiring refills on citalopram and pantoprazole  RTC 6 months  Dorothea Ogle Pharmacy Resident 07 February 2019

## 2019-04-14 ENCOUNTER — Telehealth: Payer: Self-pay | Admitting: Pharmacy Technician

## 2019-04-14 NOTE — Telephone Encounter (Signed)
Received updated proof of income.  Patient eligible to receive medication assistance at [facility] until time for re-certification in 9359, and as long as eligibility requirements continue to be met.  [place] [facility]

## 2019-04-18 ENCOUNTER — Telehealth: Payer: Self-pay | Admitting: Pharmacist

## 2019-04-18 NOTE — Telephone Encounter (Signed)
04/18/2019 10:54:31 AM - Ventolin Renewal to pt & script to Dr  -- Rhetta Mura - Monday, April 18, 2019 10:53 AM --Received note from [place] on patient's Ventolin-I have reprinted [employer] application mailing patient his portion to sign & return, also mailing script to Dr. Juliann Pares @ [facility] to sign & return for Renewal.

## 2019-04-28 ENCOUNTER — Ambulatory Visit: Payer: Self-pay | Admitting: Gerontology

## 2019-04-29 ENCOUNTER — Other Ambulatory Visit: Payer: Self-pay | Admitting: Internal Medicine

## 2019-05-05 ENCOUNTER — Ambulatory Visit: Payer: Self-pay | Admitting: Gerontology

## 2019-05-10 ENCOUNTER — Telehealth: Payer: Self-pay

## 2019-05-10 ENCOUNTER — Telehealth: Payer: Self-pay | Admitting: Gerontology

## 2019-05-10 NOTE — Telephone Encounter (Signed)
Called and LVM on 05/10/19 to reschedule no showed appt from [facility] 4/22. CV

## 2019-05-10 NOTE — Telephone Encounter (Signed)
Called and wife answerwed. She said someone called earlier already and he will call back with availability for a rescheduled appt. - NL

## 2019-05-12 ENCOUNTER — Telehealth: Payer: Self-pay | Admitting: Gerontology

## 2019-05-12 ENCOUNTER — Telehealth: Payer: Self-pay

## 2019-05-12 NOTE — Telephone Encounter (Signed)
LVM to r/s appt 4/29 @ 10:15 - MD

## 2019-05-12 NOTE — Telephone Encounter (Signed)
Called pt on 4/29 at 2:13pm about his missed appt. Call could not be completed.

## 2019-05-17 ENCOUNTER — Telehealth: Payer: Self-pay | Admitting: Gerontology

## 2019-05-17 NOTE — Telephone Encounter (Signed)
Called pt at 2:00; LVM to call back [facility] so that the appt can be rescheduled - NU

## 2019-05-19 ENCOUNTER — Telehealth: Payer: Self-pay | Admitting: Gerontology

## 2019-05-19 NOTE — Telephone Encounter (Signed)
Spoke to pt's sister. She will call back to schedule an appointment. 5/6 at 10:00 - MD

## 2019-05-26 ENCOUNTER — Telehealth: Payer: Self-pay | Admitting: Gerontology

## 2019-05-26 NOTE — Telephone Encounter (Signed)
I reached of to Cody Clay to r/s his first appt with [facility]. I was able to leave a voicemail but this is the 7th attempt to reach pt. Pt has been notified that if he still wants an appt with [place] he will have to reach out

## 2019-06-02 ENCOUNTER — Other Ambulatory Visit: Payer: Self-pay | Admitting: Internal Medicine

## 2019-11-21 ENCOUNTER — Other Ambulatory Visit: Payer: Self-pay | Admitting: Student

## 2019-11-28 ENCOUNTER — Other Ambulatory Visit: Payer: Self-pay | Admitting: Internal Medicine

## 2019-12-26 ENCOUNTER — Other Ambulatory Visit: Payer: Self-pay | Admitting: Internal Medicine

## 2020-01-26 ENCOUNTER — Other Ambulatory Visit: Payer: Self-pay | Admitting: Internal Medicine

## 2020-01-26 ENCOUNTER — Telehealth: Payer: Self-pay | Admitting: Pharmacist

## 2020-01-26 NOTE — Telephone Encounter (Signed)
01/26/2020 2:49:33 PM - ProAir HFA to replace Ventolin to pt & dr  -- Rhetta Mura - Thursday, January 26, 2020 2:47 PM --Printed [employer] application for ProAir HFA Inhale 2 puffs into the lungs every 4-6 hours as needed for wheezing or shortness of breath. Replaces Ventolin.  Mailing provider Dr. Juliann Pares @ [facility] his portion to sign & return, also mailing patient his portion to sign and return with 1 month of current household POI or letter of support.

## 2020-01-27 ENCOUNTER — Other Ambulatory Visit: Payer: Self-pay | Admitting: Internal Medicine

## 2020-04-02 ENCOUNTER — Telehealth: Payer: Self-pay | Admitting: Pharmacy Technician

## 2020-04-02 NOTE — Telephone Encounter (Signed)
Patient failed to provide 2022 proof of income.  No additional medication assistance will be provided by [facility] without the required proof of income documentation.  Patient notified by letter.  Sherilyn Dacosta Care Manager [facility]   [name] [place], [place]  [address]    April 02, 2020    Cody Clay, Cody Clay [address] [place], [place]  [address]  Dear Cody Clay:  This is to inform you that you are no longer eligible to receive medication assistance at [facility].  The reason(s) are:    _____Your total gross monthly household income exceeds 250% of the Federal Poverty Level.   _____Tangible assets (savings, checking, stocks/bonds, pension, retirement, etc.) exceeds our limit  _____You are eligible to receive benefits from [facility], [facility] or HIV Medication              Assistance Program _____You are eligible to receive benefits from a Medicare Part D plan _____You have prescription insurance  _____You are not an [facility] resident __X__Failure to provide all requested proof of income information for 2022.    Medication assistance will resume once all requested financial information has been returned to our clinic.  If you have questions, please contact our clinic at [phone].    Thank you,  [facility]

## 2020-04-23 ENCOUNTER — Other Ambulatory Visit: Payer: Self-pay

## 2020-04-23 MED FILL — Citalopram Hydrobromide Tab 10 MG (Base Equiv): ORAL | 30 days supply | Qty: 60 | Fill #0 | Status: CN

## 2020-04-23 MED FILL — Citalopram Hydrobromide Tab 10 MG (Base Equiv): ORAL | 30 days supply | Qty: 90 | Fill #0 | Status: AC

## 2020-04-23 MED FILL — Pantoprazole Sodium EC Tab 40 MG (Base Equiv): ORAL | 90 days supply | Qty: 90 | Fill #0 | Status: AC

## 2020-04-23 MED FILL — Pravastatin Sodium Tab 20 MG: ORAL | 90 days supply | Qty: 45 | Fill #0 | Status: AC

## 2020-04-23 MED FILL — Clopidogrel Bisulfate Tab 75 MG (Base Equiv): ORAL | 90 days supply | Qty: 90 | Fill #0 | Status: AC

## 2020-04-23 MED FILL — Sucralfate Tab 1 GM: ORAL | 13 days supply | Qty: 40 | Fill #0 | Status: AC

## 2020-04-23 MED FILL — Metoprolol Tartrate Tab 25 MG: ORAL | 90 days supply | Qty: 90 | Fill #0 | Status: AC

## 2020-04-24 ENCOUNTER — Other Ambulatory Visit: Payer: Self-pay

## 2020-04-25 ENCOUNTER — Other Ambulatory Visit: Payer: Self-pay

## 2020-04-25 MED ORDER — CITALOPRAM HYDROBROMIDE 20 MG PO TABS
ORAL_TABLET | ORAL | 5 refills | Status: DC
  Filled 2020-04-25: qty 30, 30d supply, fill #0

## 2020-04-25 MED ORDER — CITALOPRAM HYDROBROMIDE 10 MG PO TABS
10.0000 mg | ORAL_TABLET | Freq: Every day | ORAL | 5 refills | Status: DC
  Filled 2020-04-25: qty 60, 60d supply, fill #0

## 2020-04-26 ENCOUNTER — Other Ambulatory Visit: Payer: Self-pay

## 2020-04-27 ENCOUNTER — Other Ambulatory Visit: Payer: Self-pay

## 2020-04-27 ENCOUNTER — Other Ambulatory Visit: Payer: Self-pay | Admitting: Internal Medicine

## 2020-04-27 MED ORDER — PROAIR HFA 108 (90 BASE) MCG/ACT IN AERS
INHALATION_SPRAY | RESPIRATORY_TRACT | 3 refills | Status: DC
  Filled 2020-04-27: qty 25.5, 51d supply, fill #0
  Filled 2020-05-29: qty 25.5, 50d supply, fill #0
  Filled 2020-07-23 – 2020-08-09 (×2): qty 25.5, 50d supply, fill #1
  Filled 2020-10-31: qty 25.5, 50d supply, fill #2
  Filled 2021-01-09: qty 8.5, 17d supply, fill #3

## 2020-04-30 ENCOUNTER — Other Ambulatory Visit: Payer: Self-pay

## 2020-05-29 ENCOUNTER — Other Ambulatory Visit: Payer: Self-pay

## 2020-06-01 ENCOUNTER — Other Ambulatory Visit: Payer: Self-pay

## 2020-06-07 ENCOUNTER — Other Ambulatory Visit: Payer: Self-pay

## 2020-06-18 ENCOUNTER — Telehealth: Payer: Self-pay | Admitting: Pharmacy Technician

## 2020-06-18 NOTE — Telephone Encounter (Signed)
Received updated proof of income.  Patient eligible to receive medication assistance at [facility] until time for re-certification in 1610, and as long as eligibility requirements continue to be met.  [place] [facility]

## 2020-06-22 ENCOUNTER — Other Ambulatory Visit: Payer: Self-pay

## 2020-06-25 ENCOUNTER — Other Ambulatory Visit: Payer: Self-pay

## 2020-06-25 MED FILL — Citalopram Hydrobromide Tab 10 MG (Base Equiv): ORAL | 30 days supply | Qty: 90 | Fill #1 | Status: AC

## 2020-07-04 ENCOUNTER — Other Ambulatory Visit: Payer: Self-pay

## 2020-07-04 MED ORDER — CARVEDILOL 6.25 MG PO TABS
ORAL_TABLET | ORAL | 11 refills | Status: DC
  Filled 2020-07-04: qty 60, 30d supply, fill #0
  Filled 2020-09-05: qty 60, 30d supply, fill #1
  Filled 2020-10-29: qty 60, 30d supply, fill #2
  Filled 2021-01-09: qty 60, 30d supply, fill #3
  Filled 2021-02-01: qty 60, 30d supply, fill #4
  Filled 2021-02-27: qty 60, 30d supply, fill #5
  Filled 2021-04-10: qty 60, 30d supply, fill #6
  Filled 2021-05-09: qty 60, 30d supply, fill #7
  Filled 2021-06-03: qty 60, 30d supply, fill #0

## 2020-07-04 MED ORDER — SILDENAFIL CITRATE 50 MG PO TABS
ORAL_TABLET | ORAL | 2 refills | Status: DC
  Filled 2020-07-04: qty 10, 30d supply, fill #0
  Filled 2020-09-05: qty 10, 30d supply, fill #1
  Filled 2020-10-29 – 2020-10-31 (×2): qty 10, 30d supply, fill #2

## 2020-07-05 ENCOUNTER — Other Ambulatory Visit: Payer: Self-pay

## 2020-07-23 ENCOUNTER — Other Ambulatory Visit: Payer: Self-pay

## 2020-07-23 MED FILL — Pantoprazole Sodium EC Tab 40 MG (Base Equiv): ORAL | 90 days supply | Qty: 90 | Fill #1 | Status: AC

## 2020-07-23 MED FILL — Clopidogrel Bisulfate Tab 75 MG (Base Equiv): ORAL | 90 days supply | Qty: 90 | Fill #1 | Status: AC

## 2020-07-24 ENCOUNTER — Other Ambulatory Visit: Payer: Self-pay

## 2020-07-24 MED ORDER — PRAVASTATIN SODIUM 10 MG PO TABS
ORAL_TABLET | ORAL | 5 refills | Status: DC
  Filled 2020-07-24: qty 30, 30d supply, fill #0
  Filled 2020-09-05: qty 30, 30d supply, fill #1
  Filled 2020-10-29: qty 30, 30d supply, fill #2
  Filled 2021-01-09: qty 30, 30d supply, fill #3
  Filled 2021-02-01: qty 30, 30d supply, fill #4
  Filled 2021-02-27: qty 30, 30d supply, fill #5
  Filled 2021-04-10: qty 30, 30d supply, fill #6
  Filled 2021-05-09: qty 30, 30d supply, fill #7
  Filled 2021-06-03: qty 30, 30d supply, fill #0
  Filled 2021-07-23: qty 30, 30d supply, fill #1

## 2020-07-24 MED ORDER — CITALOPRAM HYDROBROMIDE 10 MG PO TABS
30.0000 mg | ORAL_TABLET | Freq: Every day | ORAL | 5 refills | Status: DC
  Filled 2020-07-24: qty 90, 30d supply, fill #0
  Filled 2020-09-05: qty 90, 30d supply, fill #1
  Filled 2020-10-29: qty 90, 30d supply, fill #2
  Filled 2020-11-26: qty 90, 30d supply, fill #3
  Filled 2021-01-09: qty 90, 30d supply, fill #4
  Filled 2021-02-01: qty 90, 30d supply, fill #5

## 2020-07-25 ENCOUNTER — Other Ambulatory Visit: Payer: Self-pay

## 2020-08-09 ENCOUNTER — Other Ambulatory Visit: Payer: Self-pay

## 2020-08-13 ENCOUNTER — Other Ambulatory Visit: Payer: Self-pay

## 2020-08-16 ENCOUNTER — Other Ambulatory Visit: Payer: Self-pay

## 2020-09-05 ENCOUNTER — Other Ambulatory Visit: Payer: Self-pay

## 2020-09-06 ENCOUNTER — Other Ambulatory Visit: Payer: Self-pay

## 2020-09-27 ENCOUNTER — Other Ambulatory Visit: Payer: Self-pay

## 2020-10-29 ENCOUNTER — Other Ambulatory Visit: Payer: Self-pay

## 2020-10-29 MED FILL — Pantoprazole Sodium EC Tab 40 MG (Base Equiv): ORAL | 90 days supply | Qty: 90 | Fill #2 | Status: AC

## 2020-10-29 MED FILL — Clopidogrel Bisulfate Tab 75 MG (Base Equiv): ORAL | 30 days supply | Qty: 30 | Fill #2 | Status: AC

## 2020-10-30 ENCOUNTER — Other Ambulatory Visit: Payer: Self-pay

## 2020-10-31 ENCOUNTER — Other Ambulatory Visit: Payer: Self-pay

## 2020-11-01 ENCOUNTER — Other Ambulatory Visit: Payer: Self-pay

## 2020-11-26 ENCOUNTER — Other Ambulatory Visit: Payer: Self-pay | Admitting: Gerontology

## 2020-11-26 ENCOUNTER — Other Ambulatory Visit: Payer: Self-pay

## 2020-11-27 ENCOUNTER — Other Ambulatory Visit: Payer: Self-pay

## 2020-11-29 ENCOUNTER — Other Ambulatory Visit: Payer: Self-pay

## 2020-11-29 MED ORDER — CLOPIDOGREL BISULFATE 75 MG PO TABS
ORAL_TABLET | ORAL | 3 refills | Status: DC
  Filled 2020-11-29: qty 90, 90d supply, fill #0

## 2020-12-05 ENCOUNTER — Other Ambulatory Visit: Payer: Self-pay

## 2020-12-10 ENCOUNTER — Other Ambulatory Visit: Payer: Self-pay

## 2020-12-10 MED ORDER — CLOPIDOGREL BISULFATE 75 MG PO TABS
75.0000 mg | ORAL_TABLET | Freq: Every day | ORAL | 3 refills | Status: DC
  Filled 2020-12-10 – 2021-06-03 (×3): qty 90, 90d supply, fill #0
  Filled 2021-09-09: qty 90, 90d supply, fill #1

## 2021-01-09 ENCOUNTER — Other Ambulatory Visit: Payer: Self-pay

## 2021-01-09 MED ORDER — SILDENAFIL CITRATE 50 MG PO TABS
ORAL_TABLET | ORAL | 2 refills | Status: DC
  Filled 2021-01-09: qty 10, 30d supply, fill #0
  Filled 2021-02-27: qty 10, 30d supply, fill #1
  Filled 2021-04-10: qty 10, 30d supply, fill #2

## 2021-01-10 ENCOUNTER — Other Ambulatory Visit: Payer: Self-pay

## 2021-01-29 ENCOUNTER — Other Ambulatory Visit: Payer: Self-pay

## 2021-01-29 MED ORDER — ALBUTEROL SULFATE HFA 108 (90 BASE) MCG/ACT IN AERS
INHALATION_SPRAY | RESPIRATORY_TRACT | 3 refills | Status: DC
  Filled 2021-01-29: qty 8.5, 25d supply, fill #0
  Filled 2021-02-27: qty 8.5, 25d supply, fill #1

## 2021-01-29 MED ORDER — PANTOPRAZOLE SODIUM 40 MG PO TBEC
40.0000 mg | DELAYED_RELEASE_TABLET | Freq: Every day | ORAL | 4 refills | Status: DC
  Filled 2021-01-29: qty 90, 90d supply, fill #0
  Filled 2021-05-09: qty 90, 90d supply, fill #1
  Filled 2021-08-29: qty 90, 90d supply, fill #0
  Filled 2021-12-12: qty 90, 90d supply, fill #1

## 2021-01-30 ENCOUNTER — Other Ambulatory Visit: Payer: Self-pay

## 2021-02-01 ENCOUNTER — Other Ambulatory Visit: Payer: Self-pay

## 2021-02-04 ENCOUNTER — Other Ambulatory Visit: Payer: Self-pay

## 2021-02-26 ENCOUNTER — Other Ambulatory Visit: Payer: Self-pay

## 2021-02-27 ENCOUNTER — Other Ambulatory Visit: Payer: Self-pay

## 2021-02-28 ENCOUNTER — Other Ambulatory Visit: Payer: Self-pay

## 2021-02-28 MED ORDER — CITALOPRAM HYDROBROMIDE 10 MG PO TABS
ORAL_TABLET | ORAL | 5 refills | Status: DC
  Filled 2021-02-28: qty 90, 30d supply, fill #0
  Filled 2021-04-10: qty 90, 30d supply, fill #1
  Filled 2021-05-09: qty 90, 30d supply, fill #2
  Filled 2021-06-03: qty 90, 30d supply, fill #0
  Filled 2021-07-23: qty 90, 30d supply, fill #1
  Filled 2021-08-29: qty 90, 30d supply, fill #2

## 2021-03-01 ENCOUNTER — Other Ambulatory Visit: Payer: Self-pay

## 2021-03-04 ENCOUNTER — Other Ambulatory Visit: Payer: Self-pay

## 2021-03-19 ENCOUNTER — Other Ambulatory Visit: Payer: Self-pay

## 2021-04-10 ENCOUNTER — Other Ambulatory Visit: Payer: Self-pay

## 2021-04-10 MED ORDER — ALBUTEROL SULFATE HFA 108 (90 BASE) MCG/ACT IN AERS
INHALATION_SPRAY | RESPIRATORY_TRACT | 1 refills | Status: DC
  Filled 2021-04-10: qty 6.7, 25d supply, fill #0
  Filled 2021-05-09: qty 6.7, 25d supply, fill #1

## 2021-04-11 ENCOUNTER — Other Ambulatory Visit: Payer: Self-pay

## 2021-04-29 ENCOUNTER — Other Ambulatory Visit: Payer: Self-pay

## 2021-05-09 ENCOUNTER — Other Ambulatory Visit: Payer: Self-pay

## 2021-05-10 ENCOUNTER — Other Ambulatory Visit: Payer: Self-pay

## 2021-05-16 ENCOUNTER — Other Ambulatory Visit: Payer: Self-pay

## 2021-06-03 ENCOUNTER — Other Ambulatory Visit: Payer: Self-pay

## 2021-07-23 ENCOUNTER — Other Ambulatory Visit: Payer: Self-pay

## 2021-07-23 MED ORDER — CARVEDILOL 6.25 MG PO TABS
ORAL_TABLET | ORAL | 11 refills | Status: DC
  Filled 2021-07-23: qty 60, 30d supply, fill #0
  Filled 2021-08-29: qty 60, 30d supply, fill #1
  Filled 2021-10-21: qty 60, 30d supply, fill #2
  Filled 2021-11-19: qty 60, 30d supply, fill #3
  Filled 2021-12-27: qty 60, 30d supply, fill #4
  Filled 2022-01-29: qty 60, 30d supply, fill #5
  Filled 2022-02-28: qty 60, 30d supply, fill #6
  Filled 2022-04-03: qty 60, 30d supply, fill #7
  Filled 2022-05-02: qty 60, 30d supply, fill #8
  Filled 2022-05-29: qty 60, 30d supply, fill #9
  Filled 2022-07-02: qty 60, 30d supply, fill #10

## 2021-07-23 MED ORDER — SILDENAFIL CITRATE 50 MG PO TABS
ORAL_TABLET | ORAL | 2 refills | Status: AC
Start: 2021-07-23 — End: ?
  Filled 2021-07-23: qty 10, 30d supply, fill #0

## 2021-07-23 MED ORDER — ALBUTEROL SULFATE HFA 108 (90 BASE) MCG/ACT IN AERS
INHALATION_SPRAY | RESPIRATORY_TRACT | 1 refills | Status: DC
  Filled 2021-07-23: qty 6.7, 25d supply, fill #0
  Filled 2021-08-29: qty 6.7, 25d supply, fill #1

## 2021-07-24 ENCOUNTER — Other Ambulatory Visit: Payer: Self-pay

## 2021-08-29 ENCOUNTER — Other Ambulatory Visit: Payer: Self-pay

## 2021-08-29 MED ORDER — PRAVASTATIN SODIUM 10 MG PO TABS
ORAL_TABLET | ORAL | 5 refills | Status: DC
  Filled 2021-08-29: qty 60, 60d supply, fill #0
  Filled 2021-10-21: qty 60, 60d supply, fill #1
  Filled 2021-12-27: qty 60, 60d supply, fill #2
  Filled 2022-02-28: qty 60, 60d supply, fill #3
  Filled 2022-05-02: qty 60, 60d supply, fill #4
  Filled 2022-07-02: qty 60, 60d supply, fill #5

## 2021-09-09 ENCOUNTER — Other Ambulatory Visit: Payer: Self-pay

## 2021-10-21 ENCOUNTER — Other Ambulatory Visit: Payer: Self-pay

## 2021-10-22 ENCOUNTER — Other Ambulatory Visit: Payer: Self-pay

## 2021-10-23 ENCOUNTER — Other Ambulatory Visit: Payer: Self-pay

## 2021-10-24 ENCOUNTER — Other Ambulatory Visit: Payer: Self-pay

## 2021-10-24 MED ORDER — ALBUTEROL SULFATE HFA 108 (90 BASE) MCG/ACT IN AERS
INHALATION_SPRAY | RESPIRATORY_TRACT | 2 refills | Status: DC
  Filled 2021-10-24: qty 6.7, 25d supply, fill #0
  Filled 2021-11-19: qty 6.7, 25d supply, fill #1
  Filled 2021-12-27: qty 6.7, 25d supply, fill #2

## 2021-10-24 MED ORDER — CITALOPRAM HYDROBROMIDE 10 MG PO TABS
ORAL_TABLET | ORAL | 5 refills | Status: DC
  Filled 2021-10-24: qty 90, 30d supply, fill #0
  Filled 2021-11-19: qty 90, 30d supply, fill #1
  Filled 2021-12-27: qty 90, 30d supply, fill #2
  Filled 2022-01-29: qty 90, 30d supply, fill #3
  Filled 2022-02-28: qty 90, 30d supply, fill #4
  Filled 2022-04-03: qty 90, 30d supply, fill #5

## 2021-11-19 ENCOUNTER — Other Ambulatory Visit: Payer: Self-pay

## 2021-12-12 ENCOUNTER — Other Ambulatory Visit: Payer: Self-pay

## 2021-12-12 MED ORDER — CLOPIDOGREL BISULFATE 75 MG PO TABS
75.0000 mg | ORAL_TABLET | Freq: Every day | ORAL | 3 refills | Status: DC
  Filled 2021-12-12: qty 90, 90d supply, fill #0
  Filled 2022-03-12: qty 90, 90d supply, fill #1
  Filled 2022-06-10: qty 90, 90d supply, fill #2
  Filled 2022-09-24: qty 30, 30d supply, fill #3
  Filled 2022-10-27: qty 30, 30d supply, fill #4

## 2021-12-27 ENCOUNTER — Other Ambulatory Visit: Payer: Self-pay

## 2022-01-29 ENCOUNTER — Other Ambulatory Visit: Payer: Self-pay

## 2022-02-28 ENCOUNTER — Other Ambulatory Visit: Payer: Self-pay

## 2022-03-12 ENCOUNTER — Other Ambulatory Visit: Payer: Self-pay

## 2022-03-12 MED ORDER — ALBUTEROL SULFATE HFA 108 (90 BASE) MCG/ACT IN AERS
2.0000 | INHALATION_SPRAY | Freq: Four times a day (QID) | RESPIRATORY_TRACT | 2 refills | Status: DC | PRN
  Filled 2022-03-12: qty 6.7, 25d supply, fill #0
  Filled 2022-04-03: qty 6.7, 25d supply, fill #1
  Filled 2022-05-02: qty 6.7, 25d supply, fill #2

## 2022-03-12 MED ORDER — PANTOPRAZOLE SODIUM 40 MG PO TBEC
40.0000 mg | DELAYED_RELEASE_TABLET | Freq: Every day | ORAL | 3 refills | Status: DC
  Filled 2022-03-12: qty 90, 90d supply, fill #0
  Filled 2022-07-02 (×2): qty 90, 90d supply, fill #1
  Filled 2022-09-24: qty 30, 30d supply, fill #2
  Filled 2022-10-27: qty 30, 30d supply, fill #3

## 2022-03-12 MED ORDER — AMOXICILLIN 500 MG PO CAPS
500.0000 mg | ORAL_CAPSULE | Freq: Three times a day (TID) | ORAL | 0 refills | Status: DC
  Filled 2022-03-12: qty 30, 10d supply, fill #0

## 2022-03-12 MED ORDER — TRAZODONE HCL 50 MG PO TABS
50.0000 mg | ORAL_TABLET | ORAL | 0 refills | Status: DC
  Filled 2022-03-12: qty 30, 30d supply, fill #0

## 2022-03-20 ENCOUNTER — Other Ambulatory Visit: Payer: Self-pay

## 2022-03-24 ENCOUNTER — Other Ambulatory Visit: Payer: Self-pay

## 2022-04-03 ENCOUNTER — Other Ambulatory Visit: Payer: Self-pay

## 2022-05-02 ENCOUNTER — Other Ambulatory Visit: Payer: Self-pay

## 2022-05-02 MED ORDER — CITALOPRAM HYDROBROMIDE 10 MG PO TABS
30.0000 mg | ORAL_TABLET | Freq: Every day | ORAL | 5 refills | Status: DC
  Filled 2022-05-02: qty 90, 30d supply, fill #0
  Filled 2022-05-29: qty 90, 30d supply, fill #1
  Filled 2022-07-02: qty 90, 30d supply, fill #2
  Filled 2022-08-01: qty 90, 30d supply, fill #3
  Filled 2022-09-02: qty 90, 30d supply, fill #4
  Filled 2022-10-06: qty 90, 30d supply, fill #5

## 2022-05-29 ENCOUNTER — Other Ambulatory Visit: Payer: Self-pay

## 2022-05-29 MED ORDER — CLONAZEPAM 1 MG PO TABS
1.0000 mg | ORAL_TABLET | Freq: Every evening | ORAL | 3 refills | Status: AC | PRN
  Filled 2022-05-29: qty 30, 30d supply, fill #0
  Filled 2022-07-02: qty 30, 30d supply, fill #1
  Filled 2022-08-01: qty 30, 30d supply, fill #2
  Filled 2022-09-02: qty 30, 30d supply, fill #3

## 2022-06-03 ENCOUNTER — Other Ambulatory Visit: Payer: Self-pay

## 2022-06-04 ENCOUNTER — Other Ambulatory Visit: Payer: Self-pay

## 2022-06-10 ENCOUNTER — Other Ambulatory Visit: Payer: Self-pay

## 2022-06-10 MED ORDER — DOXYCYCLINE MONOHYDRATE 100 MG PO CAPS
100.0000 mg | ORAL_CAPSULE | Freq: Two times a day (BID) | ORAL | 0 refills | Status: DC
  Filled 2022-06-10 (×2): qty 10, 5d supply, fill #0

## 2022-07-01 ENCOUNTER — Other Ambulatory Visit: Payer: Self-pay

## 2022-07-02 ENCOUNTER — Other Ambulatory Visit: Payer: Self-pay

## 2022-08-01 ENCOUNTER — Other Ambulatory Visit: Payer: Self-pay

## 2022-08-01 MED ORDER — CARVEDILOL 6.25 MG PO TABS
6.2500 mg | ORAL_TABLET | Freq: Two times a day (BID) | ORAL | 11 refills | Status: DC
  Filled 2022-08-01 – 2023-02-12 (×2): qty 60, 30d supply, fill #0
  Filled 2023-03-11: qty 60, 30d supply, fill #1
  Filled 2023-04-15: qty 60, 30d supply, fill #2
  Filled 2023-05-21: qty 60, 30d supply, fill #3
  Filled 2023-06-16: qty 60, 30d supply, fill #4

## 2022-08-15 ENCOUNTER — Other Ambulatory Visit: Payer: Self-pay

## 2022-09-01 ENCOUNTER — Other Ambulatory Visit: Payer: Self-pay

## 2022-09-02 ENCOUNTER — Other Ambulatory Visit: Payer: Self-pay

## 2022-09-02 MED ORDER — PRAVASTATIN SODIUM 10 MG PO TABS
10.0000 mg | ORAL_TABLET | Freq: Every evening | ORAL | 0 refills | Status: DC
  Filled 2022-09-02: qty 60, 60d supply, fill #0

## 2022-09-02 MED ORDER — ALBUTEROL SULFATE HFA 108 (90 BASE) MCG/ACT IN AERS
2.0000 | INHALATION_SPRAY | Freq: Four times a day (QID) | RESPIRATORY_TRACT | 2 refills | Status: DC | PRN
  Filled 2022-09-02: qty 6.7, 25d supply, fill #0
  Filled 2022-10-06: qty 6.7, 25d supply, fill #1
  Filled 2023-02-12: qty 6.7, 25d supply, fill #2

## 2022-09-24 ENCOUNTER — Other Ambulatory Visit: Payer: Self-pay

## 2022-10-06 ENCOUNTER — Other Ambulatory Visit: Payer: Self-pay

## 2022-10-08 ENCOUNTER — Other Ambulatory Visit: Payer: Self-pay

## 2022-10-09 ENCOUNTER — Other Ambulatory Visit: Payer: Self-pay

## 2022-10-10 ENCOUNTER — Other Ambulatory Visit: Payer: Self-pay

## 2022-10-12 ENCOUNTER — Other Ambulatory Visit: Payer: Self-pay

## 2022-10-12 MED ORDER — CLONAZEPAM 1 MG PO TABS
1.0000 mg | ORAL_TABLET | Freq: Every evening | ORAL | 3 refills | Status: DC | PRN
  Filled 2022-10-12: qty 30, 30d supply, fill #0
  Filled 2023-01-12: qty 30, 30d supply, fill #1
  Filled 2023-02-12: qty 30, 30d supply, fill #2
  Filled 2023-03-11: qty 30, 30d supply, fill #3

## 2022-10-27 ENCOUNTER — Other Ambulatory Visit: Payer: Self-pay

## 2022-11-06 ENCOUNTER — Other Ambulatory Visit: Payer: Self-pay

## 2022-11-06 MED ORDER — PRAVASTATIN SODIUM 10 MG PO TABS
10.0000 mg | ORAL_TABLET | Freq: Every evening | ORAL | 0 refills | Status: DC
  Filled 2022-11-06: qty 60, 60d supply, fill #0

## 2022-11-06 MED ORDER — CITALOPRAM HYDROBROMIDE 10 MG PO TABS
30.0000 mg | ORAL_TABLET | Freq: Every day | ORAL | 5 refills | Status: DC
  Filled 2022-11-06: qty 90, 30d supply, fill #0
  Filled 2022-12-04: qty 90, 30d supply, fill #1
  Filled 2023-02-12: qty 90, 30d supply, fill #2
  Filled 2023-03-11: qty 90, 30d supply, fill #3
  Filled 2023-04-15: qty 90, 30d supply, fill #4
  Filled 2023-05-21: qty 90, 30d supply, fill #5

## 2022-11-07 ENCOUNTER — Other Ambulatory Visit: Payer: Self-pay

## 2022-11-07 MED ORDER — PANTOPRAZOLE SODIUM 40 MG PO TBEC
40.0000 mg | DELAYED_RELEASE_TABLET | Freq: Every day | ORAL | 3 refills | Status: DC
  Filled 2022-11-07 – 2022-12-04 (×2): qty 90, 90d supply, fill #0
  Filled 2023-03-11: qty 90, 90d supply, fill #1

## 2022-11-07 MED ORDER — PRAVASTATIN SODIUM 10 MG PO TABS
10.0000 mg | ORAL_TABLET | Freq: Every day | ORAL | 0 refills | Status: DC
  Filled 2022-11-07: qty 60, 60d supply, fill #0

## 2022-11-07 MED ORDER — CLOPIDOGREL BISULFATE 75 MG PO TABS
75.0000 mg | ORAL_TABLET | Freq: Every day | ORAL | 3 refills | Status: DC
  Filled 2022-11-07: qty 90, 90d supply, fill #0
  Filled 2023-03-11: qty 90, 90d supply, fill #1

## 2022-11-07 MED ORDER — CITALOPRAM HYDROBROMIDE 10 MG PO TABS
10.0000 mg | ORAL_TABLET | Freq: Every day | ORAL | 5 refills | Status: DC
  Filled 2022-11-07 – 2023-01-12 (×2): qty 90, 90d supply, fill #0

## 2022-12-04 ENCOUNTER — Other Ambulatory Visit: Payer: Self-pay

## 2023-01-12 ENCOUNTER — Other Ambulatory Visit: Payer: Self-pay

## 2023-01-14 DIAGNOSIS — I4891 Unspecified atrial fibrillation: Secondary | ICD-10-CM

## 2023-01-14 HISTORY — DX: Unspecified atrial fibrillation: I48.91

## 2023-02-12 ENCOUNTER — Other Ambulatory Visit: Payer: Self-pay

## 2023-03-11 ENCOUNTER — Other Ambulatory Visit: Payer: Self-pay

## 2023-03-11 ENCOUNTER — Other Ambulatory Visit (HOSPITAL_COMMUNITY): Payer: Self-pay

## 2023-03-11 MED ORDER — PRAVASTATIN SODIUM 10 MG PO TABS
10.0000 mg | ORAL_TABLET | Freq: Every day | ORAL | 0 refills | Status: DC
  Filled 2023-03-11: qty 60, 60d supply, fill #0

## 2023-03-11 MED ORDER — ALBUTEROL SULFATE HFA 108 (90 BASE) MCG/ACT IN AERS
2.0000 | INHALATION_SPRAY | Freq: Four times a day (QID) | RESPIRATORY_TRACT | 2 refills | Status: DC | PRN
  Filled 2023-03-11: qty 6.7, 25d supply, fill #0
  Filled 2023-03-18: qty 18, 25d supply, fill #0
  Filled 2023-04-15: qty 18, 25d supply, fill #1
  Filled 2023-05-21: qty 18, 25d supply, fill #2

## 2023-03-18 ENCOUNTER — Other Ambulatory Visit: Payer: Self-pay

## 2023-04-14 DIAGNOSIS — K859 Acute pancreatitis without necrosis or infection, unspecified: Secondary | ICD-10-CM

## 2023-04-14 HISTORY — DX: Acute pancreatitis without necrosis or infection, unspecified: K85.90

## 2023-04-15 ENCOUNTER — Other Ambulatory Visit: Payer: Self-pay

## 2023-04-16 ENCOUNTER — Other Ambulatory Visit: Payer: Self-pay

## 2023-04-30 DIAGNOSIS — I4892 Unspecified atrial flutter: Secondary | ICD-10-CM

## 2023-04-30 HISTORY — DX: Unspecified atrial flutter: I48.92

## 2023-05-21 ENCOUNTER — Other Ambulatory Visit: Payer: Self-pay

## 2023-05-21 MED ORDER — PRAVASTATIN SODIUM 10 MG PO TABS
10.0000 mg | ORAL_TABLET | Freq: Every day | ORAL | 11 refills | Status: AC
  Filled 2023-05-21: qty 30, 30d supply, fill #0
  Filled 2023-06-16: qty 30, 30d supply, fill #1
  Filled 2023-07-27: qty 30, 30d supply, fill #2
  Filled 2023-08-27: qty 30, 30d supply, fill #3
  Filled 2023-09-30: qty 30, 30d supply, fill #4
  Filled 2023-10-28: qty 30, 30d supply, fill #5

## 2023-06-16 ENCOUNTER — Other Ambulatory Visit: Payer: Self-pay

## 2023-06-17 ENCOUNTER — Other Ambulatory Visit: Payer: Self-pay

## 2023-06-18 ENCOUNTER — Other Ambulatory Visit: Payer: Self-pay

## 2023-06-18 MED ORDER — ALBUTEROL SULFATE HFA 108 (90 BASE) MCG/ACT IN AERS
2.0000 | INHALATION_SPRAY | Freq: Four times a day (QID) | RESPIRATORY_TRACT | 2 refills | Status: AC | PRN
  Filled 2023-06-18: qty 18, 25d supply, fill #0
  Filled 2023-07-27: qty 18, 25d supply, fill #1
  Filled 2023-08-27: qty 18, 25d supply, fill #2

## 2023-06-18 MED ORDER — CITALOPRAM HYDROBROMIDE 20 MG PO TABS
30.0000 mg | ORAL_TABLET | Freq: Every day | ORAL | 5 refills | Status: AC
  Filled 2023-06-18: qty 45, 30d supply, fill #0
  Filled 2023-07-27: qty 45, 30d supply, fill #1
  Filled 2023-08-27: qty 45, 30d supply, fill #2
  Filled 2023-10-28: qty 45, 30d supply, fill #3

## 2023-06-19 ENCOUNTER — Other Ambulatory Visit: Payer: Self-pay | Admitting: Surgery

## 2023-06-19 DIAGNOSIS — K858 Other acute pancreatitis without necrosis or infection: Secondary | ICD-10-CM

## 2023-06-23 ENCOUNTER — Ambulatory Visit

## 2023-06-26 ENCOUNTER — Ambulatory Visit
Admission: RE | Admit: 2023-06-26 | Discharge: 2023-06-26 | Disposition: A | Discharge status: Home / Self Care | Source: Ambulatory Visit | Attending: Surgery

## 2023-06-26 DIAGNOSIS — K858 Other acute pancreatitis without necrosis or infection: Secondary | ICD-10-CM | POA: Insufficient documentation

## 2023-06-26 MED ORDER — IOHEXOL 350 MG/ML SOLN
75.0000 mL | Freq: Once | INTRAVENOUS | Status: AC | PRN
  Administered 2023-06-26: 75 mL via INTRAVENOUS

## 2023-06-29 ENCOUNTER — Ambulatory Visit: Payer: Self-pay | Admitting: Surgery

## 2023-06-29 NOTE — H&P (Signed)
 Subjective:   CC: Other acute pancreatitis without infection or necrosis (HHS-HCC) [K85.80]  HPI: Cody Clay is a 58 y.o. male who was referred by Cody Clay,* for evaluation of above CC. Episode of acute pancreatitis requiring hospitalization at [facility]. Most recent CT imaging shows persistent peripancreatic fluid collection. Patient reports that there was no specific follow-up recommended regarding the peripancreatic fluid collection but was requesting to see a surgeon as an outpatient for interval cholecystectomy.  Patient states that he is still having persistent abdominal pain in the epigastric extending towards the left flank. Has been provided some oxycodone  but that has not touched the pain.  He is down to drinking only 2 cans of beer every day.  Past Medical History: has no past medical history on file.  Past Surgical History: has no past surgical history on file.  Family History: family history is not on file.  Social History: reports that he has been smoking cigarettes. He has never used smokeless tobacco. He reports current alcohol use of about 2.0 standard drinks of alcohol per week. He reports that he does not currently use drugs.  Current Medications: has a current medication list which includes the following prescription(s): acetaminophen , albuterol  mdi (proventil , ventolin , proair ) hfa, amiodarone, carvedilol , chlorhexidine, citalopram , clonazepam , digoxin, furosemide, metoprolol  tartrate, pantoprazole , pravastatin , celecoxib, hydrocodone-acetaminophen , and trazodone .  Allergies:  Allergies as of 05/27/2023 - Reviewed 05/27/2023  Allergen Reaction Noted  Penicillins Rash and Swelling 05/25/2017   ROS:  A 15 point review of systems was performed and pertinent positives and negatives noted in HPI   Objective:    BP 119/80  Pulse 61  Ht 182.9 cm (6')  Wt (!) 119.6 kg (263 lb 9.6 oz)  BMI 35.75 kg/m   Constitutional : No distress, cooperative, alert   Lymphatics/Throat: Supple with no lymphadenopathy  Respiratory: Clear to auscultation bilaterally  Cardiovascular: Regular rate and rhythm  Gastrointestinal: Soft, Tenderness to palpation extending from epigastric area towards left flank aligning typical location of pancreas. No overlying skin changes  Musculoskeletal: Steady gait and movement  Skin: Cool and moist,  Psychiatric: Normal affect, non-agitated, not confused    LABS:  N/A   RADS: - Interval improvement in stranding surrounding the pancreas. The peripancreatic fluid collection anterior to the pancreatic head and abutting the posterior wall of the stomach is mildly decreased in size when compared to prior. The peripancreatic fluid collection posterior to the tail the pancreas is unchanged from prior.  - Similar mild extrahepatic biliary ductal dilatation.  - Moderate size paraesophageal hernia.  - Hepatic steatosis.  - Subtle patchy groundglass opacities in the lung bases which could reflect sequela of aspiration and/or infection. Narrative  EXAM: CT ABDOMEN PELVIS W CONTRAST ACCESSION: [id number] UN REPORT DATE: 05/07/2023 10:40 PM CLINICAL INDICATION: 58 years old with Pancreatitis, worsening abdominal pain   COMPARISON: MRI abdomen 05/01/2023 and CT abdomen pelvis 05/01/2023  TECHNIQUE: A helical CT scan of the abdomen and pelvis was obtained following IV contrast from the lung bases through the pubic symphysis. Images were reconstructed in the axial plane. Coronal and sagittal reformatted images were also provided for further evaluation.  FINDINGS:  LOWER CHEST: Subtle patchy groundglass opacities in the lung bases. Moderate sized paraesophageal hernia.  LIVER: Normal liver contour. Hepatic steatosis.  BILIARY: Cholelithiasis. Similar dilation of the common bile duct measuring up to 1.2 cm with mild distal tapering.  SPLEEN: Normal in size and contour.  PANCREAS: Improvement of stranding around the pancreas.  Pancreatic head bilobed fluid collection measuring 4.9  x 3.6 cm, previously 5.2 x 4.2 cm (2:48). Pancreatic tail 1.2 cm posterior fluid collection, unchanged from prior (2:47).   ADRENAL GLANDS: Normal appearance of the adrenal glands.  KIDNEYS/URETERS: Symmetric renal enhancement. Punctate nonobstructing left renal calculus. No hydronephrosis. No solid renal mass. Right upper pole renal cyst.  BLADDER: Unremarkable.  REPRODUCTIVE ORGANS: Unremarkable.  GI TRACT: Stomach is unremarkable. Small duodenal diverticulum. Small bowel is normal in caliber. No acute colonic pathology. Colonic diverticulosis. Normal appendix.  PERITONEUM, RETROPERITONEUM AND MESENTERY: No free air. No ascites. No fluid collection.  LYMPH NODES: No adenopathy.  VESSELS: Hepatic and portal veins are patent. Normal caliber aorta with moderate calcifications.   BONES and SOFT TISSUES: Multilevel degenerative changes of the thoracolumbar spine greatest in the lower lumbar spine. Diffuse body wall edema. Procedure Note  Cody Clay, Cody Fuse, MD - 05/08/2023 Formatting of this note might be different from the original. EXAM: CT ABDOMEN PELVIS W CONTRAST ACCESSION: [id number] UN REPORT DATE: 05/07/2023 10:40 PM CLINICAL INDICATION: 58 years old with Pancreatitis, worsening abdominal pain   COMPARISON: MRI abdomen 05/01/2023 and CT abdomen pelvis 05/01/2023  TECHNIQUE: A helical CT scan of the abdomen and pelvis was obtained following IV contrast from the lung bases through the pubic symphysis. Images were reconstructed in the axial plane. Coronal and sagittal reformatted images were also provided for further evaluation.  FINDINGS:  LOWER CHEST: Subtle patchy groundglass opacities in the lung bases. Moderate sized paraesophageal hernia.  LIVER: Normal liver contour. Hepatic steatosis.  BILIARY: Cholelithiasis. Similar dilation of the common bile duct measuring up to 1.2 cm with mild distal  tapering.  SPLEEN: Normal in size and contour.  PANCREAS: Improvement of stranding around the pancreas. Pancreatic head bilobed fluid collection measuring 4.9 x 3.6 cm, previously 5.2 x 4.2 cm (2:48). Pancreatic tail 1.2 cm posterior fluid collection, unchanged from prior (2:47).   ADRENAL GLANDS: Normal appearance of the adrenal glands.  KIDNEYS/URETERS: Symmetric renal enhancement. Punctate nonobstructing left renal calculus. No hydronephrosis. No solid renal mass. Right upper pole renal cyst.  BLADDER: Unremarkable.  REPRODUCTIVE ORGANS: Unremarkable.  GI TRACT: Stomach is unremarkable. Small duodenal diverticulum. Small bowel is normal in caliber. No acute colonic pathology. Colonic diverticulosis. Normal appendix.  PERITONEUM, RETROPERITONEUM AND MESENTERY: No free air. No ascites. No fluid collection.  LYMPH NODES: No adenopathy.  VESSELS: Hepatic and portal veins are patent. Normal caliber aorta with moderate calcifications.   BONES and SOFT TISSUES: Multilevel degenerative changes of the thoracolumbar spine greatest in the lower lumbar spine. Diffuse body wall edema.  IMPRESSION: - Interval improvement in stranding surrounding the pancreas. The peripancreatic fluid collection anterior to the pancreatic head and abutting the posterior wall of the stomach is mildly decreased in size when compared to prior. The peripancreatic fluid collection posterior to the tail the pancreas is unchanged from prior.  - Similar mild extrahepatic biliary ductal dilatation.  - Moderate size paraesophageal hernia.  - Hepatic steatosis.  - Subtle patchy groundglass opacities in the lung bases which could reflect sequela of aspiration and/or infection. Exam End: 05/07/23 17:11  Specimen Collected: 05/07/23 22:40 Last Resulted: 05/08/23 09:10  Received From: [facility] Result Received: 05/27/23 09:43   Assessment:    Other acute pancreatitis without infection or necrosis (HHS-HCC)  [K85.80]  gallstones  Plan:    1. Other acute pancreatitis without infection or necrosis (HHS-HCC) [K85.80] Patient still presenting with moderate tenderness to palpation along the outline of the pancreas. Concern for persistent pancreatitis at this time. Pain level not  severe enough to warrant hospitalization, vitals are stable. Recommended proceeding with repeat CT scan to assess the fluid collection that was previously noted. If suitable for cyst formation is of concern, may need additional intervention regarding the pseudocyst prior to considering interval cholecystectomy. Etiology of the pancreatitis potentially a combination of gallstone as well as his current alcohol use.  Advised patient to stop alcohol altogether if at all possible during workup. Refill Norco placed in the meantime for pain control. Also added Celebrex to see if we can avoid narcotics.  Office will call with results once available  For follow-up with cardiology as an outpatient, patient has converted back to sinus rhythm and no further intervention is needed. Eliquis has been discontinued per their recommendations as well per pt report.  Update: CT negative for any any pseudocyst formation.  Will proceed with robotic assisted laparoscopic cholecystectomy  Discussed the risk of surgery including post-op infxn, seroma, biloma, chronic pain, poor-delayed wound healing, retained gallstone, conversion to open procedure, post-op SBO or ileus, and need for additional procedures to address said risks.  The risks of general anesthetic including MI, CVA, sudden death or even reaction to anesthetic medications also discussed. Alternatives include continued observation.  Benefits include possible symptom relief, prevention of complications including acute cholecystitis, pancreatitis.  Typical post operative recovery of 3-5 days rest, continued pain in area and incision sites, possible loose stools up to 4-6 weeks, also  discussed.  The patient understands the risks, any and all questions were answered to the patient's satisfaction. Labs/images/medications/previous chart entries reviewed personally and relevant changes/updates noted above.

## 2023-06-29 NOTE — H&P (View-Only) (Signed)
 Subjective:   CC: Other acute pancreatitis without infection or necrosis (HHS-HCC) [K85.80]  HPI: Cody Clay is a 58 y.o. male who was referred by Cody Clay,* for evaluation of above CC. Episode of acute pancreatitis requiring hospitalization at [facility]. Most recent CT imaging shows persistent peripancreatic fluid collection. Patient reports that there was no specific follow-up recommended regarding the peripancreatic fluid collection but was requesting to see a surgeon as an outpatient for interval cholecystectomy.  Patient states that he is still having persistent abdominal pain in the epigastric extending towards the left flank. Has been provided some oxycodone  but that has not touched the pain.  He is down to drinking only 2 cans of beer every day.  Past Medical History: has no past medical history on file.  Past Surgical History: has no past surgical history on file.  Family History: family history is not on file.  Social History: reports that he has been smoking cigarettes. He has never used smokeless tobacco. He reports current alcohol use of about 2.0 standard drinks of alcohol per week. He reports that he does not currently use drugs.  Current Medications: has a current medication list which includes the following prescription(s): acetaminophen , albuterol  mdi (proventil , ventolin , proair ) hfa, amiodarone, carvedilol , chlorhexidine, citalopram , clonazepam , digoxin, furosemide, metoprolol  tartrate, pantoprazole , pravastatin , celecoxib, hydrocodone-acetaminophen , and trazodone .  Allergies:  Allergies as of 05/27/2023 - Reviewed 05/27/2023  Allergen Reaction Noted  Penicillins Rash and Swelling 05/25/2017   ROS:  A 15 point review of systems was performed and pertinent positives and negatives noted in HPI   Objective:    BP 119/80  Pulse 61  Ht 182.9 cm (6')  Wt (!) 119.6 kg (263 lb 9.6 oz)  BMI 35.75 kg/m   Constitutional : No distress, cooperative, alert   Lymphatics/Throat: Supple with no lymphadenopathy  Respiratory: Clear to auscultation bilaterally  Cardiovascular: Regular rate and rhythm  Gastrointestinal: Soft, Tenderness to palpation extending from epigastric area towards left flank aligning typical location of pancreas. No overlying skin changes  Musculoskeletal: Steady gait and movement  Skin: Cool and moist,  Psychiatric: Normal affect, non-agitated, not confused    LABS:  N/A   RADS: - Interval improvement in stranding surrounding the pancreas. The peripancreatic fluid collection anterior to the pancreatic head and abutting the posterior wall of the stomach is mildly decreased in size when compared to prior. The peripancreatic fluid collection posterior to the tail the pancreas is unchanged from prior.  - Similar mild extrahepatic biliary ductal dilatation.  - Moderate size paraesophageal hernia.  - Hepatic steatosis.  - Subtle patchy groundglass opacities in the lung bases which could reflect sequela of aspiration and/or infection. Narrative  EXAM: CT ABDOMEN PELVIS W CONTRAST ACCESSION: [id number] UN REPORT DATE: 05/07/2023 10:40 PM CLINICAL INDICATION: 58 years old with Pancreatitis, worsening abdominal pain   COMPARISON: MRI abdomen 05/01/2023 and CT abdomen pelvis 05/01/2023  TECHNIQUE: A helical CT scan of the abdomen and pelvis was obtained following IV contrast from the lung bases through the pubic symphysis. Images were reconstructed in the axial plane. Coronal and sagittal reformatted images were also provided for further evaluation.  FINDINGS:  LOWER CHEST: Subtle patchy groundglass opacities in the lung bases. Moderate sized paraesophageal hernia.  LIVER: Normal liver contour. Hepatic steatosis.  BILIARY: Cholelithiasis. Similar dilation of the common bile duct measuring up to 1.2 cm with mild distal tapering.  SPLEEN: Normal in size and contour.  PANCREAS: Improvement of stranding around the pancreas.  Pancreatic head bilobed fluid collection measuring 4.9  x 3.6 cm, previously 5.2 x 4.2 cm (2:48). Pancreatic tail 1.2 cm posterior fluid collection, unchanged from prior (2:47).   ADRENAL GLANDS: Normal appearance of the adrenal glands.  KIDNEYS/URETERS: Symmetric renal enhancement. Punctate nonobstructing left renal calculus. No hydronephrosis. No solid renal mass. Right upper pole renal cyst.  BLADDER: Unremarkable.  REPRODUCTIVE ORGANS: Unremarkable.  GI TRACT: Stomach is unremarkable. Small duodenal diverticulum. Small bowel is normal in caliber. No acute colonic pathology. Colonic diverticulosis. Normal appendix.  PERITONEUM, RETROPERITONEUM AND MESENTERY: No free air. No ascites. No fluid collection.  LYMPH NODES: No adenopathy.  VESSELS: Hepatic and portal veins are patent. Normal caliber aorta with moderate calcifications.   BONES and SOFT TISSUES: Multilevel degenerative changes of the thoracolumbar spine greatest in the lower lumbar spine. Diffuse body wall edema. Procedure Note  [name], [name], MD - 05/08/2023 Formatting of this note might be different from the original. EXAM: CT ABDOMEN PELVIS W CONTRAST ACCESSION: [id number] UN REPORT DATE: 05/07/2023 10:40 PM CLINICAL INDICATION: 58 years old with Pancreatitis, worsening abdominal pain   COMPARISON: MRI abdomen 05/01/2023 and CT abdomen pelvis 05/01/2023  TECHNIQUE: A helical CT scan of the abdomen and pelvis was obtained following IV contrast from the lung bases through the pubic symphysis. Images were reconstructed in the axial plane. Coronal and sagittal reformatted images were also provided for further evaluation.  FINDINGS:  LOWER CHEST: Subtle patchy groundglass opacities in the lung bases. Moderate sized paraesophageal hernia.  LIVER: Normal liver contour. Hepatic steatosis.  BILIARY: Cholelithiasis. Similar dilation of the common bile duct measuring up to 1.2 cm with mild distal  tapering.  SPLEEN: Normal in size and contour.  PANCREAS: Improvement of stranding around the pancreas. Pancreatic head bilobed fluid collection measuring 4.9 x 3.6 cm, previously 5.2 x 4.2 cm (2:48). Pancreatic tail 1.2 cm posterior fluid collection, unchanged from prior (2:47).   ADRENAL GLANDS: Normal appearance of the adrenal glands.  KIDNEYS/URETERS: Symmetric renal enhancement. Punctate nonobstructing left renal calculus. No hydronephrosis. No solid renal mass. Right upper pole renal cyst.  BLADDER: Unremarkable.  REPRODUCTIVE ORGANS: Unremarkable.  GI TRACT: Stomach is unremarkable. Small duodenal diverticulum. Small bowel is normal in caliber. No acute colonic pathology. Colonic diverticulosis. Normal appendix.  PERITONEUM, RETROPERITONEUM AND MESENTERY: No free air. No ascites. No fluid collection.  LYMPH NODES: No adenopathy.  VESSELS: Hepatic and portal veins are patent. Normal caliber aorta with moderate calcifications.   BONES and SOFT TISSUES: Multilevel degenerative changes of the thoracolumbar spine greatest in the lower lumbar spine. Diffuse body wall edema.  IMPRESSION: - Interval improvement in stranding surrounding the pancreas. The peripancreatic fluid collection anterior to the pancreatic head and abutting the posterior wall of the stomach is mildly decreased in size when compared to prior. The peripancreatic fluid collection posterior to the tail the pancreas is unchanged from prior.  - Similar mild extrahepatic biliary ductal dilatation.  - Moderate size paraesophageal hernia.  - Hepatic steatosis.  - Subtle patchy groundglass opacities in the lung bases which could reflect sequela of aspiration and/or infection. Exam End: 05/07/23 17:11  Specimen Collected: 05/07/23 22:40 Last Resulted: 05/08/23 09:10  Received From: [facility] Result Received: 05/27/23 09:43   Assessment:    Other acute pancreatitis without infection or necrosis (HHS-HCC)  [K85.80]  gallstones  Plan:    1. Other acute pancreatitis without infection or necrosis (HHS-HCC) [K85.80] Patient still presenting with moderate tenderness to palpation along the outline of the pancreas. Concern for persistent pancreatitis at this time. Pain level not  severe enough to warrant hospitalization, vitals are stable. Recommended proceeding with repeat CT scan to assess the fluid collection that was previously noted. If suitable for cyst formation is of concern, may need additional intervention regarding the pseudocyst prior to considering interval cholecystectomy. Etiology of the pancreatitis potentially a combination of gallstone as well as his current alcohol use.  Advised patient to stop alcohol altogether if at all possible during workup. Refill Norco placed in the meantime for pain control. Also added Celebrex to see if we can avoid narcotics.  Office will call with results once available  For follow-up with cardiology as an outpatient, patient has converted back to sinus rhythm and no further intervention is needed. Eliquis has been discontinued per their recommendations as well per pt report.  Update: CT negative for any any pseudocyst formation.  Will proceed with robotic assisted laparoscopic cholecystectomy  Discussed the risk of surgery including post-op infxn, seroma, biloma, chronic pain, poor-delayed wound healing, retained gallstone, conversion to open procedure, post-op SBO or ileus, and need for additional procedures to address said risks.  The risks of general anesthetic including MI, CVA, sudden death or even reaction to anesthetic medications also discussed. Alternatives include continued observation.  Benefits include possible symptom relief, prevention of complications including acute cholecystitis, pancreatitis.  Typical post operative recovery of 3-5 days rest, continued pain in area and incision sites, possible loose stools up to 4-6 weeks, also  discussed.  The patient understands the risks, any and all questions were answered to the patient's satisfaction. Labs/images/medications/previous chart entries reviewed personally and relevant changes/updates noted above.

## 2023-06-30 ENCOUNTER — Encounter
Admission: RE | Admit: 2023-06-30 | Discharge: 2023-06-30 | Disposition: A | Discharge status: Home / Self Care | Source: Ambulatory Visit | Attending: Surgery | Admitting: Surgery

## 2023-06-30 ENCOUNTER — Other Ambulatory Visit: Payer: Self-pay

## 2023-06-30 HISTORY — DX: Sleep apnea, unspecified: G47.30

## 2023-06-30 HISTORY — DX: Angina pectoris, unspecified: I20.9

## 2023-06-30 HISTORY — DX: Presence of coronary angioplasty implant and graft: Z95.5

## 2023-06-30 HISTORY — DX: Personal history of urinary calculi: Z87.442

## 2023-06-30 HISTORY — DX: Tobacco use: Z72.0

## 2023-06-30 HISTORY — DX: Cardiac arrhythmia, unspecified: I49.9

## 2023-06-30 HISTORY — DX: Essential (primary) hypertension: I10

## 2023-06-30 HISTORY — DX: Atherosclerotic heart disease of native coronary artery without angina pectoris: I25.10

## 2023-06-30 HISTORY — DX: Chronic obstructive pulmonary disease, unspecified: J44.9

## 2023-06-30 HISTORY — DX: Anxiety disorder, unspecified: F41.9

## 2023-06-30 HISTORY — DX: Unspecified osteoarthritis, unspecified site: M19.90

## 2023-06-30 HISTORY — DX: Dyspnea, unspecified: R06.00

## 2023-06-30 HISTORY — DX: Obesity, unspecified: E66.9

## 2023-06-30 HISTORY — DX: Localized edema: R60.0

## 2023-06-30 NOTE — Patient Instructions (Addendum)
 Your procedure is scheduled on: 07/03/23 - Friday Report to the Registration Desk on the 1st floor of the [facility]. To find out your arrival time, please call [phone] between 1PM - 3PM on: 07/02/23 - Thursday If your arrival time is 6:00 am, do not arrive before that time as the [facility] entrance doors do not open until 6:00 am.  REMEMBER: Instructions that are not followed completely may result in serious medical risk, up to and including death; or upon the discretion of your surgeon and anesthesiologist your surgery may need to be rescheduled.  Do not eat food after midnight the night before surgery.  No gum chewing or hard candies.  You may however, drink CLEAR liquids up to 2 hours before you are scheduled to arrive for your surgery. Do not drink anything within 2 hours of your scheduled arrival time.  Clear liquids include: - water  - apple juice without pulp - gatorade (not RED colors) - black coffee or tea (Do NOT add milk or creamers to the coffee or tea) Do NOT drink anything that is not on this list.  One week prior to surgery: Stop Anti-inflammatories (NSAIDS) such as Advil, Aleve, Ibuprofen, Motrin, Naproxen, Naprosyn and Aspirin  based products such as Excedrin, Goody's Powder, BC Powder. You may take Tylenol  if needed for pain up until the day of surgery.  Stop ANY OVER THE COUNTER supplements until after surgery.  Continue taking all of your other prescription medications up until the day before surgery.  ON THE DAY OF SURGERY ONLY TAKE THESE MEDICATIONS WITH SIPS OF WATER:  amiodarone (PACERONE)  citalopram  (CELEXA )  metoprolol  tartrate  pantoprazole  (PROTONIX )    Use inhalers albuterol  (VENTOLIN  HFA) on the day of surgery and bring to the hospital.   No Alcohol for 24 hours before or after surgery.  No Smoking including e-cigarettes for 24 hours before surgery.  No chewable tobacco products for at least 6 hours before surgery.  No nicotine  patches on the day of surgery.  Do not use any recreational drugs for at least a week (preferably 2 weeks) before your surgery.  Please be advised that the combination of cocaine and anesthesia may have negative outcomes, up to and including death. If you test positive for cocaine, your surgery will be cancelled.  On the morning of surgery brush your teeth with toothpaste and water, you may rinse your mouth with mouthwash if you wish. Do not swallow any toothpaste or mouthwash.   Do not wear jewelry, make-up, hairpins, clips or nail polish.  For welded (permanent) jewelry: bracelets, anklets, waist bands, etc.  Please have this removed prior to surgery.  If it is not removed, there is a chance that hospital personnel will need to cut it off on the day of surgery.  Do not wear lotions, powders, or perfumes.   Do not shave body hair from the neck down 48 hours before surgery.  Contact lenses, hearing aids and dentures may not be worn into surgery.  Do not bring valuables to the hospital. [facility] is not responsible for any missing/lost belongings or valuables.   Notify your doctor if there is any change in your medical condition (cold, fever, infection).  Wear comfortable clothing (specific to your surgery type) to the hospital.  After surgery, you can help prevent lung complications by doing breathing exercises.  Take deep breaths and cough every 1-2 hours. Your doctor may order a device called an Incentive Spirometer to help you take deep  breaths.  When coughing or sneezing, hold a pillow firmly against your incision with both hands. This is called "splinting." Doing this helps protect your incision. It also decreases belly discomfort.  If you are being admitted to the hospital overnight, leave your suitcase in the car. After surgery it may be brought to your room.  In case of increased patient census, it may be necessary for you, the patient, to continue your postoperative care  in the Same Day Surgery department.  If you are being discharged the day of surgery, you will not be allowed to drive home. You will need a responsible individual to drive you home and stay with you for 24 hours after surgery.   If you are taking public transportation, you will need to have a responsible individual with you.  Please call the Pre-admissions Testing Dept. at [phone] if you have any questions about these instructions.  Surgery Visitation Policy:  Patients having surgery or a procedure may have two visitors.  Children under the age of 38 must have an adult with them who is not the patient.  Inpatient Visitation:    Visiting hours are 7 a.m. to 8 p.m. Up to four visitors are allowed at one time in a patient room. The visitors may rotate out with other people during the day.  One visitor age 20 or older may stay with the patient overnight and must be in the room by 8 p.m.

## 2023-06-30 NOTE — Pre-Procedure Instructions (Signed)
 Received a callback from [facility] at Dr. Windell Hasty office, she clarified that patient is taking Eliquis 5 mg bid, and he has been informed to hold 2 days prior and restart 2 days after surgery.

## 2023-06-30 NOTE — Pre-Procedure Instructions (Signed)
 Call made and message sent to Dr. Windell Hasty office to clarify if patient is taking Eliquis at this time, if he is , we need stop date for up and coming surgery.

## 2023-07-01 ENCOUNTER — Encounter: Payer: Self-pay | Admitting: Surgery

## 2023-07-01 NOTE — Progress Notes (Signed)
 Perioperative / Anesthesia Services  Pre-Admission Testing Clinical Review / Pre-Operative Anesthesia Consult  Date: 07/01/23  PATIENT DEMOGRAPHICS: Name: Cody Clay. DOB: [birth year] MRN:   [id number]  Note: Available PAT nursing documentation and vital signs have been reviewed. Clinical nursing staff has updated patient's PMH/PSHx, current medication list, and drug allergies/intolerances to ensure complete and comprehensive history available to assist care teams in MDM as it pertains to the aforementioned surgical procedure and anticipated anesthetic course. Extensive review of available clinical information personally performed. Benld PMH and PSHx updated with any diagnoses/procedures that  may have been inadvertently omitted during his intake with the pre-admission testing department's nursing staff.  PLANNED SURGICAL PROCEDURE(S):   Case: [id number] Date/Time: 07/03/23 1045   Procedure: CHOLECYSTECTOMY, ROBOT-ASSISTED, LAPAROSCOPIC (Abdomen)   Anesthesia type: General   Pre-op diagnosis: k85.1 Gallstones pancreatitis   Location: [facility] OR ROOM 06 / [facility] ORS FOR ANESTHESIA GROUP   Surgeons: Conrado Delay, DO        CLINICAL DISCUSSION: Cody Fenter. is a 58 y.o. male who is submitted for pre-surgical anesthesia review and clearance prior to him undergoing the above procedure. Patient is a Current Smoker. Pertinent PMH includes: CAD, inferolateral STEMI, atrial flutter, aortic root dilatation, aortic atherosclerosis, angina, HTN, HLD, COPD, OSAH (no nocturnal PAP therapy), GERD (on daily PPI), hiatal hernia, peripheral edema, nephrolithiasis, OA, insomnia, anxiety.  Patient is followed by cardiology Beau Bound, MD). He was last seen in the cardiology clinic on 05/13/2023; notes reviewed. At the time of his clinic visit, patient doing well overall from a cardiovascular perspective.  Patient recently admitted for issues with his gallbladder and atrial fibrillation.  Since  discharge, he has felt some better.  The complaints of abdominal pain and exertional dyspnea, however patient continues to smoke.  He is not sleeping well, however he does not utilizing his nocturnal PAP therapy.  He has been more fatigued.  Patient denied any chest pain, PND, orthopnea, palpitations, significant peripheral edema, weakness, vertiginous symptoms, or presyncope/syncope. Patient with a past medical history significant for cardiovascular diagnoses. Documented physical exam was grossly benign, providing no evidence of acute exacerbation and/or decompensation of the patient's known cardiovascular conditions.  Patient suffered an inferolateral STEMI on 09/05/2016.  Diagnostic LEFT heart catheterization was performed revealing a 99% percent stenosis of the proximal to mid LAD and a 90% stenosis of ostial D1.  PCI was subsequently performed placing a 4.0 x 28 mm Xience Alpine DES x 1 to the proximal LAD.  Procedure yielded excellent angiographic result and TIMI-3 flow.  Most recent myocardial perfusion imaging study was performed on 10/03/2016 revealing a  normal left ventricular systolic function with an EF of 55-65%.  There were no regional wall motion abnormalities.  No artifact or left ventricular cavity size enlargement appreciated on review of imaging. SPECT images demonstrated no evidence of stress-induced myocardial ischemia or arrhythmia; no scintigraphic evidence of scar.  TID ratio = 0.89.  Patient able to achieve a total of 8.1 METS.  He demonstrated a hypertensive response to exercise.  Study was truncated due to complaints of claudication.  Overall exercise capacity was moderately impaired.  Study determined to be normal and low risk.  Most recent TTE performed on 05/01/2023 revealed a normal left ventricular systolic function with an EF of 55%. There were no regional wall motion abnormalities.  Left ventricular diastolic Doppler parameters were normal.  Right ventricular size and  function normal. There was no significant valvular regurgitation.  All transvalvular gradients were noted  to be normal providing no evidence of hemodynamically significant valvular stenosis. Aorta normal in size with no evidence of ectasia or aneurysmal dilatation.  Patient recently diagnosed with atrial fibrillation on 04/30/2023; CHA2DS2-VASc Score = 2 (HTN, prior MI).  Rate and rhythm currently being maintained on oral amiodarone + metoprolol  tartrate.  Patient being chronically anticoagulated using apixaban.  Blood pressure well-controlled at 104/74 mmHg on currently prescribed diuretic (furosemide), ACEi (lisinopril ), and beta-blocker (metoprolol  tartrate) therapies.  Patient is on pravastatin  for his HLD diagnosis and further ASCVD prevention.  He is not diabetic.  Patient does have an OSAH diagnosis, however he is noncompliant with his prescribed nocturnal PAP therapy resulting in sleep difficulties and potentially contributing to his atrial fibrillation. Patient is able to complete all of his  ADL/IADLs without cardiovascular limitation.  Per the DASI, patient is able to achieve at least 4 METS of physical activity without experiencing any significant degree of angina/anginal equivalent symptoms.  No changes were made to his medication regimen.  Patient to follow-up with outpatient cardiology in 1 month or sooner if needed.  Cody Endo. is scheduled for an elective CHOLECYSTECTOMY, ROBOT-ASSISTED, LAPAROSCOPIC (Abdomen) on 07/03/2023 with Dr. Conrado Delay, DO. Given patient's past medical history significant for cardiovascular diagnoses, presurgical cardiac clearance was sought by the PAT team. Per cardiology, this patient is optimized for surgery and may proceed with the planned procedural course with a LOW risk of significant perioperative cardiovascular complications.  Again, this patient is on daily oral anticoagulation therapy using a DOAC medication.  He has been instructed on  recommendations for holding his apixaban for 2 days prior to his procedure with plans to restart as soon as postoperative bleeding risk felt to be minimized by his primary attending surgeon. The patient has been instructed that his last dose of should be on 06/30/2023.  Patient denies previous perioperative complications with anesthesia in the past. In review his EMR, there are no records available for review pertaining to any anesthetic courses within the [facility] system in the recent past.    MOST RECENT VITAL SIGNS:    02/02/2018    2:25 PM 05/25/2017   11:36 AM 02/09/2017    7:45 AM  Vitals with BMI  Height 6' 0    Weight 314 lbs 287 lbs   BMI 42.58    Systolic 110 130 161  Diastolic 80 76 76  Pulse   86   PROVIDERS/SPECIALISTS: NOTE: Primary physician provider listed below. Patient may have been seen by APP or partner within same practice.   PROVIDER ROLE / SPECIALTY LAST Manny Sees, DO  General Surgery (Surgeon) 05/27/2023  Pcp, No Primary Care Provider N/A  Thais Fill, MD Cardiology 05/13/2023   ALLERGIES: Allergies  Allergen Reactions   Penicillins Swelling and Rash    Happened as a child. Does not remember reaction.     CURRENT HOME MEDICATIONS: No current facility-administered medications for this encounter.    albuterol  (VENTOLIN  HFA) 108 (90 Base) MCG/ACT inhaler   amiodarone (PACERONE) 100 MG tablet   apixaban (ELIQUIS) 5 MG TABS tablet   citalopram  (CELEXA ) 20 MG tablet   clonazePAM  (KLONOPIN ) 1 MG tablet   furosemide (LASIX) 20 MG tablet   lisinopril  (PRINIVIL ,ZESTRIL ) 5 MG tablet   metoprolol  tartrate (LOPRESSOR ) 25 MG tablet   pantoprazole  (PROTONIX ) 40 MG tablet   pravastatin  (PRAVACHOL ) 10 MG tablet   promethazine (PHENERGAN) 25 MG tablet   HISTORY: Past Medical History:  Diagnosis Date   Anginal pain (  HCC)    Anxiety    a.) takes BZO (clonazepam ) PRN   Aortic atherosclerosis (HCC)    Aortic root dilatation (HCC)    Arthritis     COPD (chronic obstructive pulmonary disease) (HCC)    Coronary artery disease    Diverticulosis    Dyspnea    Edema of both legs    GERD (gastroesophageal reflux disease)    Hiatal hernia    HLD (hyperlipidemia)    HTN (hypertension)    Hypertension    Insomnia    Kidney stones    Long term current use of amiodarone    New onset atrial flutter (HCC) 04/30/2023   a.) new onset 04/30/2023; b.) CHA2DS2VASc = 2 (HTN, prior MI) as of 07/01/2023; c.) rate/rhythm maintained on oral amiodarone + metoprolol  tartrate; chronically anticoagulated with apixaban   Obesity with body mass index of 30.0-39.9    On apixaban therapy    OSA (obstructive sleep apnea)    a.) does not require nocturnal PAP therapy   Pancreatitis, acute 04/2023   ST elevation myocardial infarction (STEMI) of inferolateral wall (HCC) 09/05/2016   a.) LHC/PCI 09/05/2016: 99% p-mLAD (4.0 x 28 mm Xience Alpine DES)   Tobacco use    Past Surgical History:  Procedure Laterality Date   ABDOMINAL SURGERY     CORONARY/GRAFT ACUTE MI REVASCULARIZATION N/A 09/05/2016   Procedure: Coronary/Graft Acute MI Revascularization;  Surgeon: Antonette Batters, MD;  Location: [facility] INVASIVE CV LAB;  Service: Cardiovascular;  Laterality: N/A;  prox lad   HERNIA REPAIR     LEFT HEART CATH AND CORONARY ANGIOGRAPHY N/A 09/05/2016   Procedure: LEFT HEART CATH AND CORONARY ANGIOGRAPHY;  Surgeon: Antonette Batters, MD;  Location: [facility] INVASIVE CV LAB;  Service: Cardiovascular;  Laterality: N/A;   Right knee surgery     Family History  Problem Relation Age of Onset   CAD Mother    Hypertension Father    Cerebral aneurysm Maternal Grandmother    CAD Maternal Grandfather    Social History   Tobacco Use   Smoking status: Every Day    Current packs/day: 0.50    Types: Cigarettes   Smokeless tobacco: Never   Tobacco comments:    Will contact PCP about getting a Chantix Rx  Substance Use Topics   Alcohol use: Yes    Alcohol/week: 2.0  standard drinks of alcohol    Types: 2 Cans of beer per week    Comment: 2- 3 40 oz beers per day - 40 oz   LABS:  Lab Results  Component Value Date   WBC 9.3 02/09/2017   HGB 16.1 02/09/2017   HCT 47.3 02/09/2017   MCV 91.3 02/09/2017   PLT 190 02/09/2017   Lab Results  Component Value Date   NA 136 02/09/2017   CL 107 02/09/2017   K 4.0 02/09/2017   CO2 19 (L) 02/09/2017   BUN 22 (H) 02/09/2017   CREATININE 1.35 (H) 02/09/2017   GFRNONAA 59 (L) 02/09/2017   CALCIUM  8.4 (L) 02/09/2017   ALBUMIN 3.6 02/09/2017   GLUCOSE 156 (H) 02/09/2017   Lab Results  Component Value Date   HGBA1C 6.2 (H) 02/09/2017   ECG: Date: 05/22/2023  Time ECG obtained: 0957 AM Rate: 65 bpm Rhythm: normal sinus Axis (leads I and aVF): normal Intervals: PR 166 ms. QRS 72 ms. QTc 401 ms. ST segment and T wave changes: Inferior ST and T wave abnormalities  Evidence of a possible, age undetermined, prior infarct:  No Comparison: Previous tracing obtained on 05/13/2023 showed atrial flutter with 2 1 AV conduction; nonspecific inferior ST and T wave abnormalities were present.   IMAGING / PROCEDURES: MYOCARDIAL PERFUSION IMAGING STUDY (LEXISCAN) performed on 10/03/2016 Normal left ventricular systolic function with a normal LVEF of 55-65% Normal myocardial thickening and wall motion Left ventricular cavity size normal SPECT images demonstrate homogenous tracer distribution throughout the myocardium Baseline ECG showed atrial fibrillation No evidence of stress-induced myocardial ischemia or arrhythmia Study truncated due to complaints of claudication. Normal low risk study  LEFT HEART CATHETERIZATION AND CORONARY ANGIOGRAPHY performed on 09/05/2016 Low normal left ventricular systolic function with an EF of 50% Anterior apical hypokinesis Multivessel CAD 99% proximal to mid LAD 90% ostial D1 Successful PCI 4.0 x 28 mm Xience Alpine DES x 1 placed to the proximal to mid LAD yielding  excellent angiographic result and TIMI-3 flow.     IMPRESSION AND PLAN: Cody Zeiter. has been referred for pre-anesthesia review and clearance prior to him undergoing the planned anesthetic and procedural courses. Available labs, pertinent testing, and imaging results were personally reviewed by me in preparation for upcoming operative/procedural course. [facility] medical record has been updated following extensive record review and patient interview with PAT staff.   This patient has been appropriately cleared by cardiology with an overall LOW risk of patient experiencing significant perioperative cardiovascular complications. Based on clinical review performed today (07/01/23), barring any significant acute changes in the patient's overall condition, it is anticipated that he will be able to proceed with the planned surgical intervention. Any acute changes in clinical condition may necessitate his procedure being postponed and/or cancelled. Patient will meet with anesthesia team (MD and/or CRNA) on the day of his procedure for preoperative evaluation/assessment. Questions regarding anesthetic course will be fielded at that time.   Pre-surgical instructions were reviewed with the patient during his PAT appointment, and questions were fielded to satisfaction by PAT clinical staff. He has been instructed on which medications that he will need to hold prior to surgery, as well as the ones that have been deemed safe/appropriate to take on the day of his procedure. As part of the general education provided by PAT, patient made aware both verbally and in writing, that he would need to abstain from the use of any illegal substances during his perioperative course. He was advised that failure to follow the provided instructions could necessitate case cancellation or result in serious perioperative complications up to and including death. Patient encouraged to contact PAT and/or his surgeon's office to  discuss any questions or concerns that may arise prior to surgery; verbalized understanding.   Renate Caroline, MSN, APRN, FNP-C, CEN [facility]  Perioperative Services Nurse Practitioner Phone: [phone] Fax: [phone] 07/01/23 2:15 PM  NOTE: This note has been prepared using Dragon dictation software. Despite my best ability to proofread, there is always the potential that unintentional transcriptional errors may still occur from this process.

## 2023-07-02 ENCOUNTER — Other Ambulatory Visit: Payer: Self-pay

## 2023-07-02 MED ORDER — CHLORHEXIDINE GLUCONATE 0.12 % MT SOLN
15.0000 mL | Freq: Once | OROMUCOSAL | Status: AC
  Administered 2023-07-03: 15 mL via OROMUCOSAL

## 2023-07-02 MED ORDER — ORAL CARE MOUTH RINSE: 15.0000 mL | Freq: Once | OROMUCOSAL | Status: AC

## 2023-07-02 MED ORDER — CHLORHEXIDINE GLUCONATE CLOTH 2 % EX PADS
6.0000 | MEDICATED_PAD | Freq: Once | CUTANEOUS | Status: AC
  Administered 2023-07-03: 6 via TOPICAL

## 2023-07-02 MED ORDER — CEFAZOLIN SODIUM-DEXTROSE 2-4 GM/100ML-% IV SOLN
2.0000 g | INTRAVENOUS | Status: AC
  Administered 2023-07-03: 2 g via INTRAVENOUS

## 2023-07-02 MED ORDER — PANTOPRAZOLE SODIUM 40 MG PO TBEC
40.0000 mg | DELAYED_RELEASE_TABLET | Freq: Every day | ORAL | 3 refills | Status: AC
  Filled 2023-07-02 – 2023-07-27 (×2): qty 90, 90d supply, fill #0
  Filled 2023-10-28: qty 90, 90d supply, fill #1

## 2023-07-02 MED ORDER — INDOCYANINE GREEN 25 MG IV SOLR
1.2500 mg | Freq: Once | INTRAVENOUS | Status: AC
  Administered 2023-07-03: 1.25 mg via INTRAVENOUS

## 2023-07-02 MED ORDER — LACTATED RINGERS IV SOLN: INTRAVENOUS | Status: DC

## 2023-07-03 ENCOUNTER — Ambulatory Visit: Payer: Self-pay | Admitting: Urgent Care

## 2023-07-03 ENCOUNTER — Encounter
Admission: RE | Disposition: A | Discharge status: Home / Self Care | Payer: Self-pay | Source: Home / Self Care | Attending: Surgery

## 2023-07-03 ENCOUNTER — Encounter: Payer: Self-pay | Admitting: Surgery

## 2023-07-03 ENCOUNTER — Other Ambulatory Visit: Payer: Self-pay

## 2023-07-03 ENCOUNTER — Ambulatory Visit
Admission: RE | Admit: 2023-07-03 | Discharge: 2023-07-03 | Disposition: A | Discharge status: Home / Self Care | Attending: Surgery | Admitting: Surgery

## 2023-07-03 DIAGNOSIS — J449 Chronic obstructive pulmonary disease, unspecified: Secondary | ICD-10-CM | POA: Diagnosis not present

## 2023-07-03 DIAGNOSIS — F17210 Nicotine dependence, cigarettes, uncomplicated: Secondary | ICD-10-CM | POA: Insufficient documentation

## 2023-07-03 DIAGNOSIS — K859 Acute pancreatitis without necrosis or infection, unspecified: Secondary | ICD-10-CM | POA: Diagnosis present

## 2023-07-03 DIAGNOSIS — Z7901 Long term (current) use of anticoagulants: Secondary | ICD-10-CM | POA: Insufficient documentation

## 2023-07-03 DIAGNOSIS — I4891 Unspecified atrial fibrillation: Secondary | ICD-10-CM | POA: Diagnosis not present

## 2023-07-03 DIAGNOSIS — G4733 Obstructive sleep apnea (adult) (pediatric): Secondary | ICD-10-CM | POA: Diagnosis not present

## 2023-07-03 DIAGNOSIS — I4892 Unspecified atrial flutter: Secondary | ICD-10-CM | POA: Insufficient documentation

## 2023-07-03 DIAGNOSIS — M1990 Unspecified osteoarthritis, unspecified site: Secondary | ICD-10-CM | POA: Diagnosis not present

## 2023-07-03 DIAGNOSIS — K219 Gastro-esophageal reflux disease without esophagitis: Secondary | ICD-10-CM | POA: Diagnosis not present

## 2023-07-03 DIAGNOSIS — E785 Hyperlipidemia, unspecified: Secondary | ICD-10-CM | POA: Insufficient documentation

## 2023-07-03 DIAGNOSIS — I252 Old myocardial infarction: Secondary | ICD-10-CM | POA: Insufficient documentation

## 2023-07-03 DIAGNOSIS — I2510 Atherosclerotic heart disease of native coronary artery without angina pectoris: Secondary | ICD-10-CM | POA: Insufficient documentation

## 2023-07-03 DIAGNOSIS — Z955 Presence of coronary angioplasty implant and graft: Secondary | ICD-10-CM | POA: Insufficient documentation

## 2023-07-03 DIAGNOSIS — I10 Essential (primary) hypertension: Secondary | ICD-10-CM | POA: Diagnosis not present

## 2023-07-03 DIAGNOSIS — K8010 Calculus of gallbladder with chronic cholecystitis without obstruction: Secondary | ICD-10-CM | POA: Diagnosis not present

## 2023-07-03 DIAGNOSIS — F419 Anxiety disorder, unspecified: Secondary | ICD-10-CM | POA: Insufficient documentation

## 2023-07-03 DIAGNOSIS — K851 Biliary acute pancreatitis without necrosis or infection: Secondary | ICD-10-CM

## 2023-07-03 DIAGNOSIS — Z79899 Other long term (current) drug therapy: Secondary | ICD-10-CM | POA: Diagnosis not present

## 2023-07-03 DIAGNOSIS — K760 Fatty (change of) liver, not elsewhere classified: Secondary | ICD-10-CM | POA: Diagnosis not present

## 2023-07-03 DIAGNOSIS — K449 Diaphragmatic hernia without obstruction or gangrene: Secondary | ICD-10-CM | POA: Diagnosis not present

## 2023-07-03 HISTORY — DX: Long term (current) use of anticoagulants: Z79.01

## 2023-07-03 HISTORY — DX: Diverticulosis of intestine, part unspecified, without perforation or abscess without bleeding: K57.90

## 2023-07-03 HISTORY — DX: Essential (primary) hypertension: I10

## 2023-07-03 HISTORY — DX: Diaphragmatic hernia without obstruction or gangrene: K44.9

## 2023-07-03 HISTORY — DX: Other long term (current) drug therapy: Z79.899

## 2023-07-03 HISTORY — DX: Obstructive sleep apnea (adult) (pediatric): G47.33

## 2023-07-03 HISTORY — DX: Atherosclerosis of aorta: I70.0

## 2023-07-03 HISTORY — DX: Insomnia, unspecified: G47.00

## 2023-07-03 HISTORY — DX: Hyperlipidemia, unspecified: E78.5

## 2023-07-03 HISTORY — DX: Thoracic aortic ectasia: I77.810

## 2023-07-03 SURGERY — CHOLECYSTECTOMY, ROBOT-ASSISTED, LAPAROSCOPIC
Anesthesia: General | Site: Abdomen

## 2023-07-03 MED ORDER — CHLORHEXIDINE GLUCONATE 0.12 % MT SOLN
OROMUCOSAL | Status: AC
Start: 2023-07-03 — End: 2023-07-03
  Filled 2023-07-03: qty 15

## 2023-07-03 MED ORDER — SUGAMMADEX SODIUM 200 MG/2ML IV SOLN
INTRAVENOUS | Status: DC | PRN
  Administered 2023-07-03: 239.6 mg via INTRAVENOUS

## 2023-07-03 MED ORDER — ACETAMINOPHEN 10 MG/ML IV SOLN
INTRAVENOUS | Status: DC | PRN
  Administered 2023-07-03: 1000 mg via INTRAVENOUS

## 2023-07-03 MED ORDER — DEXAMETHASONE SODIUM PHOSPHATE 10 MG/ML IJ SOLN
INTRAMUSCULAR | Status: DC | PRN
  Administered 2023-07-03: 8 mg via INTRAVENOUS

## 2023-07-03 MED ORDER — FENTANYL CITRATE (PF) 100 MCG/2ML IJ SOLN
25.0000 ug | INTRAMUSCULAR | Status: DC | PRN
  Administered 2023-07-03: 25 ug via INTRAVENOUS
  Administered 2023-07-03: 50 ug via INTRAVENOUS

## 2023-07-03 MED ORDER — ACETAMINOPHEN 10 MG/ML IV SOLN
INTRAVENOUS | Status: AC
  Filled 2023-07-03: qty 100

## 2023-07-03 MED ORDER — ROCURONIUM BROMIDE 10 MG/ML (PF) SYRINGE
PREFILLED_SYRINGE | INTRAVENOUS | Status: AC
  Filled 2023-07-03: qty 10

## 2023-07-03 MED ORDER — DEXAMETHASONE SODIUM PHOSPHATE 10 MG/ML IJ SOLN
INTRAMUSCULAR | Status: AC
  Filled 2023-07-03: qty 1

## 2023-07-03 MED ORDER — OXYCODONE HCL 5 MG PO TABS
5.0000 mg | ORAL_TABLET | Freq: Three times a day (TID) | ORAL | 0 refills | Status: AC | PRN
  Filled 2023-07-03: qty 6, 2d supply, fill #0

## 2023-07-03 MED ORDER — ONDANSETRON HCL 4 MG/2ML IJ SOLN
INTRAMUSCULAR | Status: DC | PRN
  Administered 2023-07-03: 4 mg via INTRAVENOUS

## 2023-07-03 MED ORDER — DEXMEDETOMIDINE HCL IN NACL 200 MCG/50ML IV SOLN
INTRAVENOUS | Status: DC | PRN
  Administered 2023-07-03: 8 ug via INTRAVENOUS
  Administered 2023-07-03: 20 ug via INTRAVENOUS
  Administered 2023-07-03: 4 ug via INTRAVENOUS

## 2023-07-03 MED ORDER — ONDANSETRON HCL 4 MG/2ML IJ SOLN: 4.0000 mg | Freq: Once | INTRAMUSCULAR | Status: DC | PRN

## 2023-07-03 MED ORDER — ALBUTEROL SULFATE HFA 108 (90 BASE) MCG/ACT IN AERS
INHALATION_SPRAY | RESPIRATORY_TRACT | Status: DC | PRN
  Administered 2023-07-03 (×2): 4 via RESPIRATORY_TRACT

## 2023-07-03 MED ORDER — CEFAZOLIN SODIUM-DEXTROSE 2-4 GM/100ML-% IV SOLN
INTRAVENOUS | Status: AC
  Filled 2023-07-03: qty 100

## 2023-07-03 MED ORDER — LIDOCAINE-EPINEPHRINE (PF) 1 %-1:200000 IJ SOLN
INTRAMUSCULAR | Status: DC | PRN
  Administered 2023-07-03: 30 mL via INTRAMUSCULAR

## 2023-07-03 MED ORDER — FENTANYL CITRATE (PF) 100 MCG/2ML IJ SOLN
INTRAMUSCULAR | Status: AC
  Filled 2023-07-03: qty 2

## 2023-07-03 MED ORDER — EPHEDRINE 5 MG/ML INJ
INTRAVENOUS | Status: AC
Start: 2023-07-03 — End: 2023-07-03
  Filled 2023-07-03: qty 5

## 2023-07-03 MED ORDER — OXYCODONE HCL 5 MG PO TABS
5.0000 mg | ORAL_TABLET | Freq: Once | ORAL | Status: AC | PRN
  Administered 2023-07-03: 5 mg via ORAL

## 2023-07-03 MED ORDER — EPHEDRINE SULFATE-NACL 50-0.9 MG/10ML-% IV SOSY
PREFILLED_SYRINGE | INTRAVENOUS | Status: DC | PRN
  Administered 2023-07-03 (×2): 5 mg via INTRAVENOUS

## 2023-07-03 MED ORDER — IBUPROFEN 800 MG PO TABS
800.0000 mg | ORAL_TABLET | Freq: Three times a day (TID) | ORAL | 0 refills | Status: AC | PRN
  Filled 2023-07-03: qty 30, 10d supply, fill #0

## 2023-07-03 MED ORDER — DOCUSATE SODIUM 100 MG PO CAPS
100.0000 mg | ORAL_CAPSULE | Freq: Two times a day (BID) | ORAL | 0 refills | Status: AC | PRN
  Filled 2023-07-03: qty 20, 10d supply, fill #0

## 2023-07-03 MED ORDER — PROPOFOL 10 MG/ML IV BOLUS
INTRAVENOUS | Status: AC
  Filled 2023-07-03: qty 20

## 2023-07-03 MED ORDER — INDOCYANINE GREEN 25 MG IV SOLR
INTRAVENOUS | Status: AC
  Filled 2023-07-03: qty 10

## 2023-07-03 MED ORDER — MIDAZOLAM HCL 2 MG/2ML IJ SOLN
INTRAMUSCULAR | Status: DC | PRN
  Administered 2023-07-03: 2 mg via INTRAVENOUS

## 2023-07-03 MED ORDER — OXYCODONE HCL 5 MG PO TABS
ORAL_TABLET | ORAL | Status: AC
Start: 2023-07-03 — End: 2023-07-03
  Filled 2023-07-03: qty 1

## 2023-07-03 MED ORDER — 0.9 % SODIUM CHLORIDE (POUR BTL) OPTIME
TOPICAL | Status: DC | PRN
  Administered 2023-07-03: 500 mL

## 2023-07-03 MED ORDER — ROCURONIUM BROMIDE 100 MG/10ML IV SOLN
INTRAVENOUS | Status: DC | PRN
  Administered 2023-07-03: 60 mg via INTRAVENOUS
  Administered 2023-07-03: 10 mg via INTRAVENOUS
  Administered 2023-07-03: 20 mg via INTRAVENOUS

## 2023-07-03 MED ORDER — LIDOCAINE-EPINEPHRINE (PF) 1 %-1:200000 IJ SOLN
INTRAMUSCULAR | Status: AC
  Filled 2023-07-03: qty 30

## 2023-07-03 MED ORDER — LIDOCAINE HCL (CARDIAC) PF 100 MG/5ML IV SOSY
PREFILLED_SYRINGE | INTRAVENOUS | Status: DC | PRN
  Administered 2023-07-03: 100 mg via INTRAVENOUS

## 2023-07-03 MED ORDER — BUPIVACAINE HCL (PF) 0.5 % IJ SOLN
INTRAMUSCULAR | Status: AC
Start: 2023-07-03 — End: 2023-07-03
  Filled 2023-07-03: qty 30

## 2023-07-03 MED ORDER — PROPOFOL 10 MG/ML IV BOLUS
INTRAVENOUS | Status: DC | PRN
  Administered 2023-07-03: 40 mg via INTRAVENOUS
  Administered 2023-07-03: 160 mg via INTRAVENOUS

## 2023-07-03 MED ORDER — FENTANYL CITRATE (PF) 100 MCG/2ML IJ SOLN
INTRAMUSCULAR | Status: DC | PRN
  Administered 2023-07-03 (×4): 50 ug via INTRAVENOUS

## 2023-07-03 MED ORDER — OXYCODONE HCL 5 MG/5ML PO SOLN: 5.0000 mg | Freq: Once | ORAL | Status: AC | PRN

## 2023-07-03 MED ORDER — MIDAZOLAM HCL 2 MG/2ML IJ SOLN
INTRAMUSCULAR | Status: AC
  Filled 2023-07-03: qty 2

## 2023-07-03 MED ORDER — ACETAMINOPHEN 10 MG/ML IV SOLN: 1000.0000 mg | Freq: Once | INTRAVENOUS | Status: DC | PRN

## 2023-07-03 SURGICAL SUPPLY — 40 items
ANCHOR TIS RET SYS 235ML (MISCELLANEOUS) ×1 IMPLANT
BAG PRESSURE INF REUSE 1000 (BAG) IMPLANT
CATH REDDICK CHOLANGI 4FR 50CM (CATHETERS) IMPLANT
CAUTERY HOOK MNPLR 1.6 DVNC XI (INSTRUMENTS) ×1 IMPLANT
CLIP LIGATING HEMO O LOK GREEN (MISCELLANEOUS) ×1 IMPLANT
DERMABOND ADVANCED .7 DNX12 (GAUZE/BANDAGES/DRESSINGS) ×1 IMPLANT
DRAPE ARM DVNC X/XI (DISPOSABLE) ×4 IMPLANT
DRAPE C-ARM XRAY 36X54 (DRAPES) IMPLANT
DRAPE COLUMN DVNC XI (DISPOSABLE) ×1 IMPLANT
ELECTRODE REM PT RTRN 9FT ADLT (ELECTROSURGICAL) ×1 IMPLANT
FORCEPS BPLR FENES DVNC XI (FORCEP) ×1 IMPLANT
FORCEPS PROGRASP DVNC XI (FORCEP) ×1 IMPLANT
GLOVE BIOGEL PI IND STRL 7.0 (GLOVE) ×2 IMPLANT
GLOVE SURG SYN 6.5 ES PF (GLOVE) ×4 IMPLANT
GLOVE SURG SYN 6.5 PF PI (GLOVE) ×4 IMPLANT
GOWN STRL REUS W/ TWL LRG LVL3 (GOWN DISPOSABLE) ×4 IMPLANT
GRASPER SUT TROCAR 14GX15 (MISCELLANEOUS) IMPLANT
IRRIGATOR SUCT 8 DISP DVNC XI (IRRIGATION / IRRIGATOR) IMPLANT
IV NS 1000ML BAXH (IV SOLUTION) IMPLANT
KIT TURNOVER KIT A (KITS) ×1 IMPLANT
LABEL OR SOLS (LABEL) ×1 IMPLANT
MANIFOLD NEPTUNE II (INSTRUMENTS) ×1 IMPLANT
NDL HYPO 22X1.5 SAFETY MO (MISCELLANEOUS) ×1 IMPLANT
NDL INSUFFLATION 14GA 120MM (NEEDLE) ×1 IMPLANT
NEEDLE HYPO 22X1.5 SAFETY MO (MISCELLANEOUS) ×1 IMPLANT
NEEDLE INSUFFLATION 14GA 120MM (NEEDLE) ×1 IMPLANT
NS IRRIG 500ML POUR BTL (IV SOLUTION) ×1 IMPLANT
OBTURATOR OPTICALSTD 8 DVNC (TROCAR) ×1 IMPLANT
PACK LAP CHOLECYSTECTOMY (MISCELLANEOUS) ×1 IMPLANT
SEAL UNIV 5-12 XI (MISCELLANEOUS) ×4 IMPLANT
SET TUBE SMOKE EVAC HIGH FLOW (TUBING) ×1 IMPLANT
SOLUTION ELECTROSURG ANTI STCK (MISCELLANEOUS) ×1 IMPLANT
SPIKE FLUID TRANSFER (MISCELLANEOUS) ×2 IMPLANT
SUT MNCRL AB 4-0 PS2 18 (SUTURE) IMPLANT
SUT VICRYL 0 UR6 27IN ABS (SUTURE) ×1 IMPLANT
SUTURE MNCRL 4-0 27XMF (SUTURE) ×2 IMPLANT
SYR 30ML LL (SYRINGE) IMPLANT
SYSTEM WECK SHIELD CLOSURE (TROCAR) IMPLANT
TRAP FLUID SMOKE EVACUATOR (MISCELLANEOUS) ×1 IMPLANT
WATER STERILE IRR 500ML POUR (IV SOLUTION) ×1 IMPLANT

## 2023-07-03 NOTE — Anesthesia Procedure Notes (Signed)
 Procedure Name: Intubation Date/Time: 07/03/2023 10:28 AM  Performed by: Clementine Cutting, CRNAPre-anesthesia Checklist: Patient identified, Patient being monitored, Timeout performed, Emergency Drugs available and Suction available Patient Re-evaluated:Patient Re-evaluated prior to induction Oxygen Delivery Method: Circle system utilized Preoxygenation: Pre-oxygenation with 100% oxygen Induction Type: IV induction Ventilation: Oral airway inserted - appropriate to patient size and Two handed mask ventilation required Laryngoscope Size: Mac and McGrath Grade View: Grade I Tube type: Oral Tube size: 7.5 mm Number of attempts: 1 Airway Equipment and Method: Stylet Placement Confirmation: ETT inserted through vocal cords under direct vision, positive ETCO2 and breath sounds checked- equal and bilateral Secured at: 24 cm Tube secured with: Tape Dental Injury: Teeth and Oropharynx as per pre-operative assessment

## 2023-07-03 NOTE — Interval H&P Note (Signed)
 History and Physical Interval Note:  07/03/2023 12:03 PM  Cody Endo.  has presented today for surgery, with the diagnosis of k85.1 Gallstones pancreatitis.  The various methods of treatment have been discussed with the patient and family. After consideration of risks, benefits and other options for treatment, the patient has consented to  Procedure(s): CHOLECYSTECTOMY, ROBOT-ASSISTED, LAPAROSCOPIC (N/A) as a surgical intervention.  The patient's history has been reviewed, patient examined, no change in status, stable for surgery.  I have reviewed the patient's chart and labs.  Questions were answered to the patient's satisfaction.     Cody Clay

## 2023-07-03 NOTE — Anesthesia Postprocedure Evaluation (Signed)
 Anesthesia Post Note  Patient: Cody Clay.  Procedure(s) Performed: CHOLECYSTECTOMY, ROBOT-ASSISTED, LAPAROSCOPIC (Abdomen)  Patient location during evaluation: PACU Anesthesia Type: General Level of consciousness: awake and alert Pain management: pain level controlled Vital Signs Assessment: post-procedure vital signs reviewed and stable Respiratory status: spontaneous breathing, nonlabored ventilation, respiratory function stable and patient connected to nasal cannula oxygen Cardiovascular status: blood pressure returned to baseline and stable Postop Assessment: no apparent nausea or vomiting Anesthetic complications: no   No notable events documented.   Last Vitals:  Vitals:   07/03/23 1200 07/03/23 1233  BP: (P) 138/86   Pulse: (P) 65 (!) 57  Resp: (P) 13 12  Temp: (P) 36.7 C   SpO2: (P) 95% 91%    Last Pain:  Vitals:   07/03/23 1233  TempSrc:   PainSc: 7                  Lattie Poli

## 2023-07-03 NOTE — Discharge Instructions (Signed)
 Laparoscopic Cholecystectomy, Care After This sheet gives you information about how to care for yourself after your procedure. Your doctor may also give you more specific instructions. If you have problems or questions, contact your doctor. Follow these instructions at home: Care for cuts from surgery (incisions)  Follow instructions from your doctor about how to take care of your cuts from surgery. Make sure you: Wash your hands with soap and water before you change your bandage (dressing). If you cannot use soap and water, use hand sanitizer. Change your bandage as told by your doctor. Leave stitches (sutures), skin glue, or skin tape (adhesive) strips in place. They may need to stay in place for 2 weeks or longer. If tape strips get loose and curl up, you may trim the loose edges. Do not remove tape strips completely unless your doctor says it is okay. Do not take baths, swim, or use a hot tub until your doctor says it is okay. OK TO SHOWER 24HRS AFTER YOUR SURGERY.  Check your surgical cut area every day for signs of infection. Check for: More redness, swelling, or pain. More fluid or blood. Warmth. Pus or a bad smell. Activity Do not drive or use heavy machinery while taking prescription pain medicine. Do not play contact sports until your doctor says it is okay. Do not drive for 24 hours if you were given a medicine to help you relax (sedative). Rest as needed. Do not return to work or school until your doctor says it is okay. General instructions  tadvil as needed for discomfort.     Use narcotics, if prescribed, only when motrin is not enough to control pain. Resume Eliquis in 48 hours  Advil up to 800mg  per dose every 8hrs as needed for pain.   To prevent or treat constipation while you are taking prescription pain medicine, your doctor may recommend that you: Drink enough fluid to keep your pee (urine) clear or pale yellow. Take over-the-counter or prescription medicines. Eat  foods that are high in fiber, such as fresh fruits and vegetables, whole grains, and beans. Limit foods that are high in fat and processed sugars, such as fried and sweet foods. Contact a doctor if: You develop a rash. You have more redness, swelling, or pain around your surgical cuts. You have more fluid or blood coming from your surgical cuts. Your surgical cuts feel warm to the touch. You have pus or a bad smell coming from your surgical cuts. You have a fever. One or more of your surgical cuts breaks open. You have trouble breathing. You have chest pain. You have pain that is getting worse in your shoulders. You faint or feel dizzy when you stand. You have very bad pain in your belly (abdomen). You are sick to your stomach (nauseous) for more than one day. You have throwing up (vomiting) that lasts for more than one day. You have leg pain. This information is not intended to replace advice given to you by your health care provider. Make sure you discuss any questions you have with your health care provider. Document Released: 10/09/2007 Document Revised: 07/21/2015 Document Reviewed: 06/18/2015 Elsevier Interactive Patient Education  2019 [employer].

## 2023-07-03 NOTE — Transfer of Care (Signed)
 Immediate Anesthesia Transfer of Care Note  Patient: Cody Clay.  Procedure(s) Performed: CHOLECYSTECTOMY, ROBOT-ASSISTED, LAPAROSCOPIC (Abdomen)  Patient Location: PACU  Anesthesia Type:General  Level of Consciousness: awake, alert , and oriented  Airway & Oxygen Therapy: Patient Spontanous Breathing and Patient connected to face mask oxygen  Post-op Assessment: Report given to RN and Post -op Vital signs reviewed and stable  Post vital signs: Reviewed and stable  Last Vitals:  Vitals Value Taken Time  BP    Temp    Pulse    Resp    SpO2      Last Pain:  Vitals:   07/03/23 0949  TempSrc: Temporal  PainSc: 6          Complications: No notable events documented.

## 2023-07-03 NOTE — Anesthesia Preprocedure Evaluation (Addendum)
 Anesthesia Evaluation  Patient identified by MRN, date of birth, ID band Patient awake    Reviewed: Allergy & Precautions, NPO status , Patient's Chart, lab work & pertinent test results  History of Anesthesia Complications Negative for: history of anesthetic complications  Airway Mallampati: III  TM Distance: >3 FB Neck ROM: Full    Dental  (+) Edentulous Upper   Pulmonary shortness of breath and with exertion, sleep apnea and Continuous Positive Airway Pressure Ventilation , COPD, Current SmokerPatient did not abstain from smoking.    + decreased breath sounds      Cardiovascular Exercise Tolerance: Good METS: 3 - Mets hypertension, Pt. on medications + CAD, + Past MI and + Cardiac Stents  (-) dysrhythmias  Rhythm:Regular Rate:Normal - Systolic murmurs Deemed to be low risk for complications by cardiologist   Patient suffered an inferolateral STEMI on 09/05/2016.  Diagnostic LEFT heart catheterization was performed revealing a 99% percent stenosis of the proximal to mid LAD and a 90% stenosis of ostial D1.  PCI was subsequently performed placing a 4.0 x 28 mm Xience Alpine DES x 1 to the proximal LAD.  Procedure yielded excellent angiographic result and TIMI-3 flow.    Most recent myocardial perfusion imaging study was performed on 10/03/2016 revealing a  normal left ventricular systolic function with an EF of 55-65%.  There were no regional wall motion abnormalities.  No artifact or left ventricular cavity size enlargement appreciated on review of imaging. SPECT images demonstrated no evidence of stress-induced myocardial ischemia or arrhythmia; no scintigraphic evidence of scar.  TID ratio = 0.89.  Patient able to achieve a total of 8.1 METS.  He demonstrated a hypertensive response to exercise.  Study was truncated due to complaints of claudication.  Overall exercise capacity was moderately impaired.  Study determined to be normal  and low risk.    Most recent TTE performed on 05/01/2023 revealed a normal left ventricular systolic function with an EF of 55%. There were no regional wall motion abnormalities.  Left ventricular diastolic Doppler parameters were normal.  Right ventricular size and function normal. There was no significant valvular regurgitation.  All transvalvular gradients were noted to be normal providing no evidence of hemodynamically significant valvular stenosis. Aorta normal in size with no evidence of ectasia or aneurysmal dilatation.    Neuro/Psych  PSYCHIATRIC DISORDERS Anxiety     negative neurological ROS     GI/Hepatic hiatal hernia,GERD  Medicated and Controlled,,(+)     (-) substance abuse    Endo/Other  neg diabetes    Renal/GU negative Renal ROS     Musculoskeletal   Abdominal  (+) + obese  Peds  Hematology   Anesthesia Other Findings Past Medical History: No date: Anginal pain (HCC) No date: Anxiety     Comment:  a.) takes BZO (clonazepam ) PRN No date: Aortic atherosclerosis (HCC) No date: Aortic root dilatation (HCC) No date: Arthritis No date: COPD (chronic obstructive pulmonary disease) (HCC) No date: Coronary artery disease No date: Diverticulosis No date: Dyspnea No date: Edema of both legs No date: GERD (gastroesophageal reflux disease) No date: Hiatal hernia No date: HLD (hyperlipidemia) No date: HTN (hypertension) No date: Hypertension No date: Insomnia No date: Kidney stones No date: Long term current use of amiodarone 04/30/2023: New onset atrial flutter (HCC)     Comment:  a.) new onset 04/30/2023; b.) CHA2DS2VASc = 2 (HTN,               prior MI) as of 07/01/2023; c.)  rate/rhythm maintained on              oral amiodarone + metoprolol  tartrate; chronically               anticoagulated with apixaban No date: Obesity with body mass index of 30.0-39.9 No date: On apixaban therapy No date: OSA (obstructive sleep apnea)     Comment:  a.) does not  require nocturnal PAP therapy 04/2023: Pancreatitis, acute 09/05/2016: ST elevation myocardial infarction (STEMI) of  inferolateral wall (HCC)     Comment:  a.) LHC/PCI 09/05/2016: 99% p-mLAD (4.0 x 28 mm Xience               Alpine DES) No date: Tobacco use  Reproductive/Obstetrics                             Anesthesia Physical Anesthesia Plan  ASA: 3  Anesthesia Plan: General   Post-op Pain Management: Ofirmev  IV (intra-op)*   Induction: Intravenous  PONV Risk Score and Plan: 3 and Ondansetron , Dexamethasone and Midazolam   Airway Management Planned: Oral ETT and Video Laryngoscope Planned  Additional Equipment: None  Intra-op Plan:   Post-operative Plan: Extubation in OR  Informed Consent: I have reviewed the patients History and Physical, chart, labs and discussed the procedure including the risks, benefits and alternatives for the proposed anesthesia with the patient or authorized representative who has indicated his/her understanding and acceptance.     Dental advisory given  Plan Discussed with: CRNA and Surgeon  Anesthesia Plan Comments: (Discussed risks of anesthesia with patient, including PONV, sore throat, lip/dental/eye damage. Rare risks discussed as well, such as cardiorespiratory and neurological sequelae, and allergic reactions. Discussed the role of CRNA in patient's perioperative care. Patient understands. Patient counseled on benefits of smoking cessation, and increased perioperative risks associated with continued smoking. )       Anesthesia Quick Evaluation

## 2023-07-03 NOTE — Op Note (Signed)
 Preoperative diagnosis:  cholelithiasis and pancreatitis  Postoperative diagnosis: same as above  Procedure: Robotic assisted Laparoscopic Cholecystectomy.   Anesthesia: GETA   Surgeon: Conrado Delay  Specimen: Gallbladder  Complications: None  EBL: 15mL  Wound Classification: Clean Contaminated  Indications: see HPI  Findings: Critical view of safety noted Cystic duct and artery identified, ligated and divided, clips remained intact at end of procedure Adequate hemostasis  Description of procedure:  The patient was placed on the operating table in the supine position. SCDs placed, pre-op abx administered.  General anesthesia was induced and OG tube placed by anesthesia. A time-out was completed verifying correct patient, procedure, site, positioning, and implant(s) and/or special equipment prior to beginning this procedure. The abdomen was prepped and draped in the usual sterile fashion.    Veress needle was placed at the Palmer's point and insufflation was started after confirming a positive saline drop test and no immediate increase in abdominal pressure.  After reaching 15 mm, the Veress needle was removed and a 8 mm port was placed via optiview technique under umbilicus measured 20mm from gallbladder.  The abdomen was inspected and no abnormalities or injuries were found.  Under direct vision, ports were placed in the following locations: One 12 mm patient left of the umbilicus, 8cm from the periumbilical port, one 8 mm port placed to the patient right of the umbilical port 8 cm apart.  1 additional 8 mm port placed lateral to the 12mm port.  Once ports were placed, The table was placed in the reverse Trendelenburg position with the right side up. The Xi platform was brought into the operative field and docked to the ports successfully.  An endoscope was placed through the umbilical port, fenestrated grasper through the adjacent patient right port, prograsp to the far patient left  port, and then a hook cautery in the left port.  The dome of the gallbladder was grasped with prograsp, passed and retracted over the dome of the liver. Adhesions between the gallbladder and omentum, duodenum and transverse colon were lysed via hook cautery. The infundibulum was grasped with the fenestrated grasper and retracted toward the right lower quadrant. This maneuver exposed Calot's triangle. The peritoneum overlying the gallbladder infundibulum was then dissected  and the cystic duct and cystic artery identified.  Critical view of safety with the liver bed clearly visible behind the duct and artery with no additional structures noted.  The cystic duct and cystic artery clipped and divided close to the gallbladder.  extra clips applied to branch artery adjacent to cystic artery.  Gallbladder suctioned out after making hole to facilitate dissection due to large size.   The gallbladder was then dissected from its peritoneal and liver bed attachments by electrocautery. All spillage irrigated and suctioned out. Hemostasis was checked prior to removing the hook cautery and the Endo Catch bag was then placed through the 12 mm port and the gallbladder was removed.  The gallbladder was passed off the table as a specimen. There was no evidence of bleeding from the gallbladder fossa or cystic artery or leakage of the bile from the cystic duct stump. The 12 mm port site closed with PMI using 0 vicryl under direct vision.  Abdomen desufflated and secondary trocars were removed under direct vision. No bleeding was noted. All skin incisions then closed with subcuticular sutures of 4-0 monocryl and dressed with topical skin adhesive. The orogastric tube was removed and patient extubated.  The patient tolerated the procedure well and was taken to  the postanesthesia care unit in stable condition.  All sponge and instrument count correct at end of procedure.

## 2023-07-06 LAB — SURGICAL PATHOLOGY

## 2023-07-14 ENCOUNTER — Other Ambulatory Visit: Payer: Self-pay

## 2023-07-27 ENCOUNTER — Other Ambulatory Visit: Payer: Self-pay

## 2023-08-27 ENCOUNTER — Other Ambulatory Visit: Payer: Self-pay

## 2023-09-30 ENCOUNTER — Other Ambulatory Visit: Payer: Self-pay

## 2023-10-28 ENCOUNTER — Other Ambulatory Visit: Payer: Self-pay

## 2023-11-09 ENCOUNTER — Other Ambulatory Visit: Payer: Self-pay
